# Patient Record
Sex: Female | Born: 1980 | Race: Black or African American | Hispanic: No | Marital: Single | Smoking: Never smoker
Health system: Southern US, Community
[De-identification: ages and names within clinical notes are randomized; demographics above are authoritative.]

## PROBLEM LIST (undated history)

## (undated) ENCOUNTER — Inpatient Hospital Stay (HOSPITAL_COMMUNITY): Payer: Self-pay

## (undated) DIAGNOSIS — Z789 Other specified health status: Secondary | ICD-10-CM

## (undated) HISTORY — DX: Other specified health status: Z78.9

## (undated) HISTORY — PX: NO PAST SURGERIES: SHX2092

---

## 2005-11-13 ENCOUNTER — Ambulatory Visit: Payer: Self-pay | Admitting: Family Medicine

## 2005-11-20 ENCOUNTER — Ambulatory Visit (HOSPITAL_COMMUNITY): Admission: RE | Admit: 2005-11-20 | Discharge: 2005-11-20 | Payer: Self-pay | Admitting: Gynecology

## 2006-06-27 ENCOUNTER — Inpatient Hospital Stay (HOSPITAL_COMMUNITY): Admission: AD | Admit: 2006-06-27 | Discharge: 2006-06-29 | Payer: Self-pay | Admitting: Obstetrics and Gynecology

## 2007-06-09 ENCOUNTER — Encounter (INDEPENDENT_AMBULATORY_CARE_PROVIDER_SITE_OTHER): Payer: Self-pay | Admitting: Obstetrics and Gynecology

## 2007-06-09 ENCOUNTER — Ambulatory Visit (HOSPITAL_COMMUNITY): Admission: RE | Admit: 2007-06-09 | Discharge: 2007-06-09 | Payer: Self-pay | Admitting: Obstetrics and Gynecology

## 2008-01-07 ENCOUNTER — Other Ambulatory Visit: Admission: RE | Admit: 2008-01-07 | Discharge: 2008-01-07 | Payer: Self-pay | Admitting: Gynecology

## 2010-10-23 NOTE — Op Note (Signed)
[name], [name]              ACCOUNT NO.:  [id number]   MEDICAL RECORD NO.:  [id number]          PATIENT TYPE:  AMB   LOCATION:  [facility]                           FACILITY:  [facility]   PHYSICIAN:  Janine Limbo, M.D.DATE OF BIRTH:  [birth year]   DATE OF PROCEDURE:  06/09/2007  DATE OF DISCHARGE:                               OPERATIVE REPORT   PREOPERATIVE DIAGNOSES:  1. Painful right labial laceration.  2. Vulvar itching and irritation.   POSTOPERATIVE DIAGNOSES:  1. Painful right labial laceration.  2. Vulvar itching and irritation.   PROCEDURES:  1. Repair of right labial laceration.  2. Biopsy of the vulva.   SURGEON:  Dr. Leonard Schwartz.   FIRST ASSISTANT:  None.   ANESTHETIC:  Monitored anesthetic control and local 0.5% Marcaine.   DISPOSITION:  The patient is a 30 year old female, para 1-0-0-1, who had  a vaginal delivery in August 2008.  The patient has a right labial  laceration that has been very uncomfortable since the time of her  delivery.  She also complains of dyspareunia.  She wishes to have this  area repaired.  The patient also complains of vulvar irritation and she  reports that frequently she has lacerations of the vulva for unknown  reasons.  The patient understands the indications for her surgical  procedure and she accepts the risks of, but not limited to, anesthetic  complications, bleeding, infection, and possible damage to the  surrounding organs.   FINDINGS:  The right labia majus had a laceration that measured  approximately 3.5 cm.  There was no evidence of inflammation or  infection.  The the patient's perineum was generally considered to be  erythematous and the skin was very thin.  She had multiple lacerations  of unknown etiology.   PROCEDURE:  The patient was taken to the operating room where she was  given medication through her IV line.  The patient was placed in a  lithotomy position.  The perineum, and outer vagina were  prepped with  multiple layers of Betadine.  The patient was then sterilely draped.  The laceration of the right labia majus was then injected with 8 mL of  0.5% Marcaine.  The vulva was injected with 2 mL of 0.5% Marcaine at the  7 o'clock position from the introitus.  A vulvar biopsy was obtained at  the 7 o'clock position from the introitus.  A single stitch of 2-0  Vicryl was placed for hemostasis.  The right labia majus was then  incised and the incision was then extended through the length of the  laceration and into the connective tissue.  The connective tissue was  then closed using 2-0 Vicryl, reapproximating a more normal-appearing  right labia majus.  A subcuticular suture was then placed using 5-0  Monocryl.  Hemostasis was achieved using the bipolar cautery and  hemostasis was adequate at the end of our procedure.  The  reapproximation appeared to be very similar to the left at this point.  Sponge, needle, and instrument counts were correct on two occasions.  The estimated blood loss for the  procedure was 15 mL.  The patient  tolerated her procedure well.  She was awakened from her anesthetic  without difficulty and taken to the recovery room in stable condition.   FOLLOWUP INSTRUCTIONS:  The patient will return to see Dr. Stefano Gaul in 2-  3 weeks for followup examination.  She was given ibuprofen and she will  take 600 mg every 6 hours as needed for mild to moderate pain.  She will  take Darvocet-N 100 1 tablet every 4-6 hours as needed for severe pain.  She will sit in a tub of hot water each day.  She will call for  questions or concerns.      Janine Limbo, M.D.  Electronically Signed     AVS/MEDQ  D:  06/09/2007  T:  06/09/2007  Job:  [id number]

## 2010-10-23 NOTE — H&P (Signed)
NAMEKIRK, SAMPLEY              ACCOUNT NO.:  [id number]   MEDICAL RECORD NO.:  [id number]          PATIENT TYPE:  AMB   LOCATION:  [facility]                           [facility]   PHYSICIAN:  Janine Limbo, M.D.DATE OF BIRTH:  Dec 04, 1980   DATE OF ADMISSION:  DATE OF DISCHARGE:                              HISTORY & PHYSICAL   HISTORY OF PRESENT ILLNESS:  The patient is a 30 year old female, para 1-  0-0-1, who presents for repair of the labia.  The patient complains of  vulvar discomfort with burning and discomfort where she had an  obstetrical laceration on her right labia.   OBSTETRICAL HISTORY:  The patient has had one vaginal delivery at term.   DRUG ALLERGIES:  NO KNOWN DRUG ALLERGIES.   PAST MEDICAL HISTORY:  The patient denies hypertension and diabetes.   REVIEW OF SYSTEMS:  Noncontributory except as mentioned above.   FAMILY HISTORY:  Noncontributory.   SOCIAL HISTORY:  The patient denies cigarette use, alcohol use, and  recreational drug use.   PHYSICAL EXAMINATION:  HEENT:  Within normal limits.  CHEST:  Clear.  HEART:  Regular rate and rhythm.  BREASTS:  Without masses.  ABDOMEN:  Nontender.  EXTREMITIES:  Grossly normal.  NEUROLOGIC:  Grossly normal.  PELVIC EXAM:  External genitalia shows that there is some thinning of  the skin at the introitus and around the rectum.  There are small areas  of broken skin at the perineal area.  There is a laceration of the right  labia majora, and there is tenderness to examination in this area.  The  vagina is within normal limits.  The cervix is nontender.  Uterus is  normal size, shape and consistency.  Adnexa no masses.   ASSESSMENT:  1. Right labial laceration.  2. Tender right labial laceration.   PLAN:  The patient will undergo repair of the right labia.  She has  already been started on estrogen therapy, and I am hoping that the  combination of these things will relieve her discomfort.  She  understands  that no guarantees can be given.  She accepts the risks of,  but not limited to, anesthetic complications, bleeding, infection, and  possible damage to surrounding organs.      Janine Limbo, M.D.  Electronically Signed     AVS/MEDQ  D:  06/07/2007  T:  06/08/2007  Job:  [id number]

## 2010-10-26 NOTE — Group Therapy Note (Signed)
[name], [name] NO.:  [id number]   MEDICAL RECORD NO.:  [id number]          PATIENT TYPE:  WOC   LOCATION:  [facility]                   FACILITY:  [facility]   PHYSICIAN:  Montey Hora, M.D.    DATE OF BIRTH:  [birth year]   DATE OF SERVICE:  11/13/2005                                    CLINIC NOTE   The patient is seen in clinic on November 13, 2005, for pelvic pain. This is a 30  year old, G0, who has been having squeezing, cramping lower abdominal pain  for approximately one and a half years. She states that it is related to her  menstrual cycle, happening primarily just prior to her menses and just after  her menses. She speculates she does not notice it during her menstrual cycle  because she has such heavy cramping. She also has heavy bleeding. Her  menstrual cycle lasts approximately seven days, however it is not as heavy  the entire time, being primarily heavy in the center. She uses no  contraception, in fact she is trying to get pregnant and has been trying for  seven to eight months. She denies any problems with her bowels or fever. She  has no vaginal discharge. She has just been checked twice for STDs and was  found to be negative in both cases. She has had only a single partner over  the last year. She has not had any weight loss, vomiting, diarrhea. She does  have a labial lesion that is very painful, that has been present for a  couple of months, and first appeared after using some Darene Lamer depilatory cream,  and that was about two or two and a half months ago.   PAST MEDICAL HISTORY:  Not significant except for yeast vaginitis. Last Pap  smear was in February 2007. Last menses was September 23, 2005. She has never  had a mammogram.   CURRENT MEDICATIONS:  None.   ALLERGIES:  None.  No latex allergy.   SOCIAL HISTORY:  Committed relationship. Nonsmoker.   PHYSICAL EXAMINATION:  VITAL SIGNS: Temperature is 97, pulse 81, BP 127/62,  weight 120, height 5  feet 2.5 inches.  GENERAL: WDWN, NAD.  ABDOMEN: Soft and nontender. No HSM, no masses.  PELVIC: Normal external genitalia, except for some erythema around the labia  bilaterally and particularly at the superior edge of the anus. In the anal  folds there are noted to be fissures. Vaginal mucosa is normal. There is a  normal physiologic discharge. The cervix is very anterior, but normal,  nulliparous. The uterus is about 6-8 weeks size and is retroflexed sharply.  There are no adnexal masses or tenderness bilaterally.   ASSESSMENT/PLAN:  1.  Lower abdominal pain, question ovarian cyst. Will obtain a pregnancy      test and if it is negative  do a pelvic ultrasound. If positive, put in      her in OB clinic.  2.  Labial fissure. Will obtain an HSV and treat p.r.n. Also gave her some      Mycolog II cream to use very sparingly b.i.d. for a  short period of time      until healed.           ______________________________  Montey Hora, M.D.     KR/MEDQ  D:  11/13/2005  T:  11/14/2005  Job:  [id number]

## 2010-10-26 NOTE — H&P (Signed)
[name], [name]           ACCOUNT NO.:  [id number]   MEDICAL RECORD NO.:  [id number]          PATIENT TYPE:  MAT   LOCATION:  [facility]                          [facility]   PHYSICIAN:  Hal Morales, M.D.DATE OF BIRTH:  14-Feb-1981   DATE OF ADMISSION:  06/27/2006  DATE OF DISCHARGE:                              HISTORY & PHYSICAL   This is a 30 year old, gravida 1, para 0 at 39-4/7 weeks who presents  with complaints of leaking fluids. She reports painful contractions and  positive fetal movement. The pregnancy has been followed by Dr. 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  2)  Group B strep negative.   ALLERGIES:  None.   OBSTETRIC HISTORY:  The patient is primigravida.   MEDICAL HISTORY:  Remarkable for childhood varicella otherwise negative.   FAMILY HISTORY:  Negative.   GENETIC HISTORY:  Negative.   SOCIAL HISTORY:  The patient is married to [name] who is involved and  supportive. She is of the [place] faith. She denies any alcohol,  tobacco or drug use.   PRENATAL LABS:  Hemoglobin 11.5, platelets 210, blood type O+, antibody  screen negative, sickle cell negative, RPR nonreactive. Rubella immune.  Hepatitis negative. Pap test normal. Gonorrhea negative, chlamydia  negative, cystic fibrosis positive.   HISTORY OF CURRENT PREGNANCY:  The patient entered care at [redacted] weeks  gestation. She had an anatomy ultrasound at 19 weeks which was normal.  She was treated with for a yeast infection at 23 weeks. She was notified  of her positive cystic fibrosis status and husband elected to do testing  in January when he has insurance. Glucola was done at 27 weeks and was  normal. She had a negative group B strep at term.   OBJECTIVE:  VITAL SIGNS:  Stable, afebrile.  HEENT:  Within normal limits. Thyroid normal not enlarged.  CHEST:  Clear to auscultation.  HEART:  Regular rate and rhythm.  ABDOMEN:  Gravid. Vertex to  [place], __________  shows reactive fetal  heart rate with contractions every 1-1/2 minutes, positive leaking of  clear fluid, positive Nitrazine. Cervix 4 cm, 100% effaced, 0 station,  vertex presentation per RN.  EXTREMITIES:  Within normal limits.   ASSESSMENT:  1. Intrauterine pregnancy at 39-4/7 weeks.  2. Active labor.  3. Spontaneous rupture of membranes.  4. Group B strep negative.   PLAN:  1. Admit to birthing suites per Dr. Pennie Rushing.  2. Routine MD orders.      Marie L. Williams, C.N.M.      Hal Morales, M.D.  Electronically Signed    MLW/MEDQ  D:  06/27/2006  T:  06/27/2006  Job:  [id number]

## 2011-03-15 LAB — URINE MICROSCOPIC-ADD ON

## 2011-03-15 LAB — URINALYSIS, ROUTINE W REFLEX MICROSCOPIC
Bilirubin Urine: NEGATIVE
Ketones, ur: NEGATIVE
Nitrite: NEGATIVE
Protein, ur: NEGATIVE
Urobilinogen, UA: 0.2

## 2011-03-15 LAB — CBC
HCT: 38.1
Hemoglobin: 12.9
MCHC: 33.9
RBC: 4.29
RDW: 13.1

## 2011-03-15 LAB — PREGNANCY, URINE: Preg Test, Ur: NEGATIVE

## 2012-09-14 ENCOUNTER — Ambulatory Visit: Payer: Self-pay | Admitting: Obstetrics

## 2012-10-14 ENCOUNTER — Ambulatory Visit: Payer: Self-pay | Admitting: Obstetrics

## 2012-11-03 ENCOUNTER — Ambulatory Visit: Payer: Self-pay | Admitting: Obstetrics

## 2012-11-04 ENCOUNTER — Encounter: Payer: Self-pay | Admitting: Obstetrics

## 2012-11-04 ENCOUNTER — Ambulatory Visit (INDEPENDENT_AMBULATORY_CARE_PROVIDER_SITE_OTHER): Payer: 59 | Admitting: Obstetrics

## 2012-11-04 VITALS — BP 117/77 | HR 68 | Temp 96.7°F | Ht 62.5 in | Wt 127.0 lb

## 2012-11-04 DIAGNOSIS — Z01419 Encounter for gynecological examination (general) (routine) without abnormal findings: Secondary | ICD-10-CM

## 2012-11-04 DIAGNOSIS — Z0000 Encounter for general adult medical examination without abnormal findings: Secondary | ICD-10-CM

## 2012-11-04 DIAGNOSIS — N760 Acute vaginitis: Secondary | ICD-10-CM

## 2012-11-04 NOTE — Progress Notes (Signed)
  Subjective:     [name] is a 32 y.o. female and is here for a comprehensive physical exam. The patient reports abdominal pain on left side that comes and goes length unknown.  History   Social History  . Marital Status: Single    Spouse Name: N/A    Number of Children: N/A  . Years of Education: N/A   Occupational History  . Not on file.   Social History Main Topics  . Smoking status: Never Smoker   . Smokeless tobacco: Never Used  . Alcohol Use: No  . Drug Use: No  . Sexually Active: No   Other Topics Concern  . Not on file   Social History Narrative  . No narrative on file   Health Maintenance  Topic Date Due  . Pap Smear  03/21/1998  . Tetanus/tdap  03/22/1999  . Influenza Vaccine  02/08/2013    The following portions of the patient's history were reviewed and updated as appropriate: allergies, current medications, past family history, past medical history, past social history, past surgical history and problem list.  Review of Systems Pertinent items are noted in HPI.   Objective:    General appearance: alert and no distress Breasts: normal appearance, no masses or tenderness Abdomen: normal findings: soft, non-tender Pelvic: cervix normal in appearance, external genitalia normal, no adnexal masses or tenderness, no cervical motion tenderness, uterus normal size, shape, and consistency and vagina normal without discharge    Assessment:    Healthy female exam. H/O irregular cycles.  Desires future fertility.  Considering cycle regulation with OCP's until pregnancy.      Plan:     See After Visit Summary for Counseling Recommendations  Folic acid recommended. Preconception counseling done.

## 2012-11-05 ENCOUNTER — Encounter: Payer: Self-pay | Admitting: Obstetrics

## 2012-11-05 DIAGNOSIS — N760 Acute vaginitis: Secondary | ICD-10-CM | POA: Insufficient documentation

## 2012-11-05 LAB — PAP IG W/ RFLX HPV ASCU

## 2012-11-05 LAB — WET PREP BY MOLECULAR PROBE: Candida species: POSITIVE — AB

## 2012-11-05 LAB — GC/CHLAMYDIA PROBE AMP: GC Probe RNA: NEGATIVE

## 2012-11-05 NOTE — Patient Instructions (Signed)
Preconception Cycle regulation

## 2012-11-18 ENCOUNTER — Ambulatory Visit: Payer: 59 | Admitting: Obstetrics

## 2013-03-30 ENCOUNTER — Encounter: Payer: Self-pay | Admitting: Obstetrics

## 2013-03-30 ENCOUNTER — Encounter: Payer: Self-pay | Admitting: *Deleted

## 2013-03-30 ENCOUNTER — Ambulatory Visit (INDEPENDENT_AMBULATORY_CARE_PROVIDER_SITE_OTHER): Payer: 59 | Admitting: Obstetrics

## 2013-03-30 VITALS — BP 113/79 | HR 69 | Wt 121.0 lb

## 2013-03-30 DIAGNOSIS — Z0000 Encounter for general adult medical examination without abnormal findings: Secondary | ICD-10-CM

## 2013-03-30 DIAGNOSIS — F411 Generalized anxiety disorder: Secondary | ICD-10-CM

## 2013-03-30 DIAGNOSIS — N949 Unspecified condition associated with female genital organs and menstrual cycle: Secondary | ICD-10-CM

## 2013-03-30 DIAGNOSIS — F418 Other specified anxiety disorders: Secondary | ICD-10-CM | POA: Insufficient documentation

## 2013-03-30 MED ORDER — VITAFOL-ONE 29-1-200 MG PO CAPS: 1.0000 | ORAL_CAPSULE | Freq: Every day | ORAL | Status: DC

## 2013-03-30 NOTE — Progress Notes (Signed)
Subjective:     [name] is a 32 y.o. female here for a routine exam.  Current complaints: Emotionally stressed with work, supervision and family responsibilities; and just feels burnt out.  Personal health questionnaire reviewed: yes.   Gynecologic History Patient's last menstrual period was 03/25/2013. Contraception: none Last Pap: 2014. Results were: normal Last mammogram: n/a. Results were: n/a  Obstetric History OB History  No data available     The following portions of the patient's history were reviewed and updated as appropriate: allergies, current medications, past family history, past medical history, past social history, past surgical history and problem list.  Review of Systems Pertinent items are noted in HPI.    Objective:    No exam performed today, Consult only..    Assessment:    Anxiety and burn out.   Plan:    Education reviewed: Management of stress and anxiety.. Follow up in: 4 weeks.    Medical leave from work granted. Re-evaluate fitness to return to work in 4 weeks.

## 2013-03-30 NOTE — Progress Notes (Signed)
Patient is in the office today for consult to discuss her stresses at work.

## 2013-03-31 ENCOUNTER — Other Ambulatory Visit: Payer: Self-pay | Admitting: Obstetrics

## 2013-03-31 DIAGNOSIS — N949 Unspecified condition associated with female genital organs and menstrual cycle: Secondary | ICD-10-CM

## 2013-04-02 ENCOUNTER — Other Ambulatory Visit: Payer: Self-pay | Admitting: *Deleted

## 2013-04-02 ENCOUNTER — Encounter: Payer: Self-pay | Admitting: Obstetrics

## 2013-04-02 DIAGNOSIS — N949 Unspecified condition associated with female genital organs and menstrual cycle: Secondary | ICD-10-CM

## 2013-04-06 ENCOUNTER — Other Ambulatory Visit: Payer: 59

## 2013-04-13 ENCOUNTER — Ambulatory Visit (INDEPENDENT_AMBULATORY_CARE_PROVIDER_SITE_OTHER): Payer: 59

## 2013-04-13 ENCOUNTER — Encounter: Payer: Self-pay | Admitting: Obstetrics & Gynecology

## 2013-04-13 DIAGNOSIS — N926 Irregular menstruation, unspecified: Secondary | ICD-10-CM

## 2013-04-13 DIAGNOSIS — N949 Unspecified condition associated with female genital organs and menstrual cycle: Secondary | ICD-10-CM

## 2013-04-13 DIAGNOSIS — IMO0002 Reserved for concepts with insufficient information to code with codable children: Secondary | ICD-10-CM

## 2013-05-04 ENCOUNTER — Ambulatory Visit (INDEPENDENT_AMBULATORY_CARE_PROVIDER_SITE_OTHER): Payer: 59 | Admitting: Obstetrics

## 2013-05-04 ENCOUNTER — Encounter: Payer: Self-pay | Admitting: Obstetrics

## 2013-05-04 NOTE — Progress Notes (Signed)
Subjective:     [name] is a 32 y.o. female here for a follow up exam.  Current complaints:requesting extension to off work status,  irregular menstrual cycle, .  Personal health questionnaire reviewed: yes.   Gynecologic History Patient's last menstrual period was 04/18/2013. Contraception: abstinence Last Pap:11/04/2012 . Results were: normal Last mammogram: N/A  Obstetric History OB History  Gravida Para Term Preterm AB SAB TAB Ectopic Multiple Living  1 1 1       1     # Outcome Date GA Lbr Len/2nd Weight Sex Delivery Anes PTL Lv  1 TRM                The following portions of the patient's history were reviewed and updated as appropriate: allergies, current medications, past family history, past medical history, past social history, past surgical history and problem list.  Review of Systems Pertinent items are noted in HPI.    Objective:    No exam performed today, Consultation only..    Assessment:    Postpartum stress / anxiety.  Improved, but not yet resolved enough to return to work.  Recommended continued counseling.   Plan:    Education reviewed: Stress management.. Contraception: none. Follow up in: 6 weeks. Continue maternity leave until June 22, 2013.

## 2013-05-05 ENCOUNTER — Encounter: Payer: Self-pay | Admitting: Obstetrics

## 2013-05-13 ENCOUNTER — Encounter: Payer: Self-pay | Admitting: Obstetrics

## 2013-05-28 ENCOUNTER — Encounter: Payer: Self-pay | Admitting: *Deleted

## 2013-06-10 NOTE — L&D Delivery Note (Signed)
Delivery Note At 2:30 PM a non-viable unspecified sex was delivered via Vaginal, Spontaneous Delivery.  APGAR: 0, 0 .   Placenta status: Intact, removed with assistance Pathology.   Anesthesia: None  Episiotomy: None Lacerations: None Suture Repair: n/a Est. Blood Loss (mL): 150  Mom to postpartum.  Baby to [place].  JACKSON-MOORE,Arvin Abello A 02/23/2014, 2:48 PM

## 2013-06-14 ENCOUNTER — Encounter: Payer: Self-pay | Admitting: Obstetrics

## 2013-06-22 ENCOUNTER — Telehealth: Payer: Self-pay | Admitting: Internal Medicine

## 2013-06-22 NOTE — Telephone Encounter (Signed)
Pt would like to be contacted about establishing care and extending medical leave; please call and advise pt on this matter [phone]

## 2013-07-28 ENCOUNTER — Encounter: Payer: Self-pay | Admitting: Obstetrics

## 2013-08-04 ENCOUNTER — Encounter: Payer: Self-pay | Admitting: Obstetrics

## 2013-09-22 ENCOUNTER — Other Ambulatory Visit: Payer: Self-pay | Admitting: *Deleted

## 2013-09-22 DIAGNOSIS — B373 Candidiasis of vulva and vagina: Secondary | ICD-10-CM

## 2013-09-22 DIAGNOSIS — B3731 Acute candidiasis of vulva and vagina: Secondary | ICD-10-CM

## 2013-09-22 MED ORDER — TERCONAZOLE 0.4 % VA CREA: 1.0000 | TOPICAL_CREAM | Freq: Every day | VAGINAL | Status: AC

## 2014-02-03 ENCOUNTER — Encounter: Payer: Self-pay | Admitting: Obstetrics

## 2014-02-03 ENCOUNTER — Ambulatory Visit (INDEPENDENT_AMBULATORY_CARE_PROVIDER_SITE_OTHER): Payer: Medicaid Other | Admitting: Obstetrics

## 2014-02-03 VITALS — BP 109/62 | HR 83 | Temp 97.9°F | Wt 133.0 lb

## 2014-02-03 DIAGNOSIS — Z1389 Encounter for screening for other disorder: Secondary | ICD-10-CM

## 2014-02-03 DIAGNOSIS — Z3482 Encounter for supervision of other normal pregnancy, second trimester: Secondary | ICD-10-CM

## 2014-02-03 DIAGNOSIS — Z3480 Encounter for supervision of other normal pregnancy, unspecified trimester: Secondary | ICD-10-CM

## 2014-02-03 DIAGNOSIS — Z363 Encounter for antenatal screening for malformations: Secondary | ICD-10-CM

## 2014-02-03 LAB — POCT URINALYSIS DIPSTICK
BILIRUBIN UA: NEGATIVE
Blood, UA: NEGATIVE
Glucose, UA: NEGATIVE
Ketones, UA: NEGATIVE
LEUKOCYTES UA: NEGATIVE
NITRITE UA: NEGATIVE
PROTEIN UA: NEGATIVE
SPEC GRAV UA: 1.02
Urobilinogen, UA: NEGATIVE
pH, UA: 5

## 2014-02-03 LAB — OB RESULTS CONSOLE GC/CHLAMYDIA
Chlamydia: NEGATIVE
Gonorrhea: NEGATIVE

## 2014-02-03 NOTE — Progress Notes (Signed)
  Subjective:    [name] is a 33 y.o. female being seen today for her obstetrical visit. She is at [redacted]w[redacted]d gestation. Patient reports: no complaints.  Problem List Items Addressed This Visit   None    Visit Diagnoses   Encounter for supervision of other normal pregnancy in second trimester    -  Primary    Relevant Orders       Obstetric panel       HIV antibody       Hemoglobinopathy evaluation       Varicella zoster antibody, IgG       Vit D  25 hydroxy (rtn osteoporosis monitoring)       Culture, OB Urine       POCT urinalysis dipstick (Completed)       WET PREP BY MOLECULAR PROBE       GC/Chlamydia Probe Amp       Pap IG and HPV (high risk) DNA detection    Encounter for routine screening for malformation using ultrasonics        Relevant Orders       US OB Comp + 14 Wk      Patient Active Problem List   Diagnosis Date Noted  . 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 Vaginitis and vulvovaginitis, unspecified 11/05/2012    Objective:     BP 109/62  Pulse 83  Temp(Src) 97.9 F (36.6 C)  Wt 133 lb (60.328 kg)  LMP 09/29/2013 Uterine Size: Below umbilicus     Assessment:    Pregnancy @ [redacted]w[redacted]d  weeks Doing well    Plan:    Problem list reviewed and updated. Labs reviewed.  Follow up in 2 weeks. FIRST/CF mutation testing/NIPT/QUAD SCREEN/fragile X/[name] population testing/Spinal muscular atrophy discussed: requested. Role of ultrasound in pregnancy discussed; fetal survey: requested. Amniocentesis discussed: not indicated. 100% of 10 minute visit spent on counseling and coordination of care.

## 2014-02-04 LAB — OBSTETRIC PANEL
Antibody Screen: NEGATIVE
BASOS PCT: 0 % (ref 0–1)
Basophils Absolute: 0 10*3/uL (ref 0.0–0.1)
EOS ABS: 0.1 10*3/uL (ref 0.0–0.7)
Eosinophils Relative: 2 % (ref 0–5)
HEMATOCRIT: 32.8 % — AB (ref 36.0–46.0)
HEP B S AG: NEGATIVE
Hemoglobin: 11.3 g/dL — ABNORMAL LOW (ref 12.0–15.0)
LYMPHS PCT: 22 % (ref 12–46)
Lymphs Abs: 1.5 10*3/uL (ref 0.7–4.0)
MCH: 29.9 pg (ref 26.0–34.0)
MCHC: 34.5 g/dL (ref 30.0–36.0)
MCV: 86.8 fL (ref 78.0–100.0)
Monocytes Absolute: 0.5 10*3/uL (ref 0.1–1.0)
Monocytes Relative: 8 % (ref 3–12)
Neutro Abs: 4.5 10*3/uL (ref 1.7–7.7)
Neutrophils Relative %: 68 % (ref 43–77)
Platelets: 225 10*3/uL (ref 150–400)
RBC: 3.78 MIL/uL — ABNORMAL LOW (ref 3.87–5.11)
RDW: 14 % (ref 11.5–15.5)
RH TYPE: POSITIVE
Rubella: 27.2 Index — ABNORMAL HIGH (ref <0.90)
WBC: 6.6 10*3/uL (ref 4.0–10.5)

## 2014-02-04 LAB — WET PREP BY MOLECULAR PROBE
CANDIDA SPECIES: NEGATIVE
Gardnerella vaginalis: NEGATIVE
TRICHOMONAS VAG: NEGATIVE

## 2014-02-04 LAB — GC/CHLAMYDIA PROBE AMP
CT Probe RNA: NEGATIVE
GC Probe RNA: NEGATIVE

## 2014-02-04 LAB — HIV ANTIBODY (ROUTINE TESTING W REFLEX): HIV 1&2 Ab, 4th Generation: NONREACTIVE

## 2014-02-04 LAB — VITAMIN D 25 HYDROXY (VIT D DEFICIENCY, FRACTURES): Vit D, 25-Hydroxy: 51 ng/mL (ref 30–89)

## 2014-02-04 LAB — VARICELLA ZOSTER ANTIBODY, IGG

## 2014-02-05 LAB — CULTURE, OB URINE
COLONY COUNT: NO GROWTH
Organism ID, Bacteria: NO GROWTH

## 2014-02-07 LAB — HEMOGLOBINOPATHY EVALUATION
HGB A: 97 % (ref 96.8–97.8)
HGB F QUANT: 0 % (ref 0.0–2.0)
HGB S QUANTITAION: 0 %
Hemoglobin Other: 0 %
Hgb A2 Quant: 3 % (ref 2.2–3.2)

## 2014-02-07 LAB — PAP IG AND HPV HIGH-RISK: HPV DNA High Risk: DETECTED — AB

## 2014-02-10 ENCOUNTER — Ambulatory Visit (HOSPITAL_COMMUNITY): Admission: RE | Admit: 2014-02-10 | Payer: Medicaid Other | Source: Ambulatory Visit

## 2014-02-17 ENCOUNTER — Encounter: Payer: Medicaid Other | Admitting: Obstetrics

## 2014-02-18 ENCOUNTER — Ambulatory Visit (HOSPITAL_COMMUNITY): Payer: Medicaid Other

## 2014-02-22 ENCOUNTER — Inpatient Hospital Stay (HOSPITAL_COMMUNITY): Payer: Medicaid Other

## 2014-02-22 ENCOUNTER — Encounter (HOSPITAL_COMMUNITY): Payer: Self-pay

## 2014-02-22 ENCOUNTER — Encounter: Payer: Medicaid Other | Admitting: Obstetrics

## 2014-02-22 ENCOUNTER — Inpatient Hospital Stay (HOSPITAL_COMMUNITY)
Admission: AD | Admit: 2014-02-22 | Discharge: 2014-02-24 | DRG: 779 | Disposition: A | Discharge status: Home / Self Care | Payer: Medicaid Other | Source: Ambulatory Visit | Attending: Obstetrics & Gynecology | Admitting: Obstetrics & Gynecology

## 2014-02-22 DIAGNOSIS — O42919 Preterm premature rupture of membranes, unspecified as to length of time between rupture and onset of labor, unspecified trimester: Secondary | ICD-10-CM | POA: Diagnosis present

## 2014-02-22 DIAGNOSIS — O99891 Other specified diseases and conditions complicating pregnancy: Secondary | ICD-10-CM | POA: Diagnosis present

## 2014-02-22 DIAGNOSIS — O021 Missed abortion: Secondary | ICD-10-CM | POA: Diagnosis present

## 2014-02-22 DIAGNOSIS — Z1389 Encounter for screening for other disorder: Secondary | ICD-10-CM

## 2014-02-22 DIAGNOSIS — Z363 Encounter for antenatal screening for malformations: Secondary | ICD-10-CM

## 2014-02-22 DIAGNOSIS — O4290 Premature rupture of membranes, unspecified as to length of time between rupture and onset of labor, unspecified weeks of gestation: Secondary | ICD-10-CM | POA: Diagnosis present

## 2014-02-22 DIAGNOSIS — Z3687 Encounter for antenatal screening for uncertain dates: Secondary | ICD-10-CM

## 2014-02-22 LAB — CBC
HEMATOCRIT: 32 % — AB (ref 36.0–46.0)
HEMOGLOBIN: 11 g/dL — AB (ref 12.0–15.0)
MCH: 30.7 pg (ref 26.0–34.0)
MCHC: 34.4 g/dL (ref 30.0–36.0)
MCV: 89.4 fL (ref 78.0–100.0)
Platelets: 193 10*3/uL (ref 150–400)
RBC: 3.58 MIL/uL — AB (ref 3.87–5.11)
RDW: 13 % (ref 11.5–15.5)
WBC: 9.7 10*3/uL (ref 4.0–10.5)

## 2014-02-22 LAB — ABO/RH: ABO/RH(D): O POS

## 2014-02-22 LAB — TYPE AND SCREEN
ABO/RH(D): O POS
Antibody Screen: NEGATIVE

## 2014-02-22 LAB — POCT FERN TEST: POCT FERN TEST: POSITIVE

## 2014-02-22 LAB — RPR

## 2014-02-22 MED ORDER — SODIUM CHLORIDE 0.9 % IV SOLN
2.0000 g | Freq: Four times a day (QID) | INTRAVENOUS | Status: DC
  Administered 2014-02-22 – 2014-02-23 (×6): 2 g via INTRAVENOUS
  Filled 2014-02-22 (×8): qty 2000

## 2014-02-22 MED ORDER — BUTORPHANOL TARTRATE 1 MG/ML IJ SOLN: 2.0000 mg | INTRAMUSCULAR | Status: DC | PRN

## 2014-02-22 MED ORDER — CALCIUM CARBONATE ANTACID 500 MG PO CHEW
2.0000 | CHEWABLE_TABLET | ORAL | Status: DC | PRN
  Filled 2014-02-22: qty 2

## 2014-02-22 MED ORDER — ERYTHROMYCIN BASE 250 MG PO TABS
250.0000 mg | ORAL_TABLET | Freq: Four times a day (QID) | ORAL | Status: DC
  Filled 2014-02-22 (×2): qty 1

## 2014-02-22 MED ORDER — DOCUSATE SODIUM 100 MG PO CAPS
100.0000 mg | ORAL_CAPSULE | Freq: Every day | ORAL | Status: DC
  Filled 2014-02-22 (×4): qty 1

## 2014-02-22 MED ORDER — LACTATED RINGERS IV SOLN
INTRAVENOUS | Status: DC
  Administered 2014-02-22: 125 mL/h via INTRAVENOUS
  Administered 2014-02-22: 15:00:00 via INTRAVENOUS

## 2014-02-22 MED ORDER — ZOLPIDEM TARTRATE 5 MG PO TABS: 5.0000 mg | ORAL_TABLET | Freq: Every evening | ORAL | Status: DC | PRN

## 2014-02-22 MED ORDER — PRENATAL MULTIVITAMIN CH
1.0000 | ORAL_TABLET | Freq: Every day | ORAL | Status: DC
  Administered 2014-02-23: 1 via ORAL
  Filled 2014-02-22 (×5): qty 1

## 2014-02-22 MED ORDER — SODIUM CHLORIDE 0.9 % IV SOLN
250.0000 mg | Freq: Four times a day (QID) | INTRAVENOUS | Status: DC
  Administered 2014-02-22 – 2014-02-23 (×6): 250 mg via INTRAVENOUS
  Filled 2014-02-22 (×8): qty 5

## 2014-02-22 MED ORDER — AMOXICILLIN 500 MG PO CAPS
500.0000 mg | ORAL_CAPSULE | Freq: Three times a day (TID) | ORAL | Status: DC
  Filled 2014-02-22 (×2): qty 1

## 2014-02-22 MED ORDER — ACETAMINOPHEN 325 MG PO TABS: 650.0000 mg | ORAL_TABLET | ORAL | Status: DC | PRN

## 2014-02-22 NOTE — MAU Note (Signed)
Pt placed in trendelenburg; toco applied.

## 2014-02-22 NOTE — Progress Notes (Signed)
[name] is a 33 y.o. G2P1001 at [redacted]w[redacted]d by LMP admitted for PPROM.  Subjective:   Objective: BP 103/72  Pulse 83  Temp(Src) 97.5 F (36.4 C) (Oral)  Resp 20  Ht 5' 2.5" (1.588 m)  Wt 133 lb (60.328 kg)  BMI 23.92 kg/m2  LMP 09/29/2013      FHT:  150bpm UC:   Occasional SVE:    Omitted  Labs: Lab Results  Component Value Date   WBC 6.6 02/03/2014   HGB 11.3* 02/03/2014   HCT 32.8* 02/03/2014   MCV 86.8 02/03/2014   PLT 225 02/03/2014    Assessment / Plan: 20.6 weeks.  PPROM.  Stable.  Bedrest.  Labor: None Preeclampsia:  n/a Fetal Wellbeing:  Stable Pain Control:  n/a I/D:  n/a Anticipated MOD:  NSVD  HARPER,CHARLES A 02/22/2014, 9:20 AM

## 2014-02-22 NOTE — Progress Notes (Signed)
02/22/14 1700  Clinical Encounter Type  Visited With Patient  Visit Type Initial;Spiritual support;Social support  Referral From Nurse  Spiritual Encounters  Spiritual Needs Emotional;Prayer  Stress Factors  Patient Stress Factors Loss of control;Major life changes   Visited for intro to services/availability, spiritual/emotional support, encouragement.  Provided pastoral presence, witness to pt's stories, affirmation, spiritual companionship, prayer.  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  She is using faith, humor, perspective to cope.  Reports good support.  [place] will follow closely, but please page as situation changes or other needs arise.  Thank you.  [place], [place]

## 2014-02-22 NOTE — MAU Note (Signed)
Pt states that she felt pressure while at work. Went to bathroom and noticed that she had a bulging bag out of vagina. Pt called nurse into the bathroom at place of employment and says that the nurse "broke her water" around 2am/ States it was clear fluid. Denies pain or bleeding.

## 2014-02-22 NOTE — Progress Notes (Addendum)
When RN entered patient's room, patient had repositioned herself into low fowlers position. Patient reported that she felt short of breath while in trendelenburg. Patient states she wishes to stay positioned with her head elevated.

## 2014-02-22 NOTE — MAU Provider Note (Signed)
  History     CSN: [id number]  Arrival date and time: 02/22/14 0228   First Provider Initiated Contact with Patient 02/22/14 0234      Chief Complaint  Patient presents with  . Rupture of Membranes   HPI [name] is a 33 y.o. G2P1001 at [redacted]w[redacted]d by unsure LMP who presents to MAU today with complaint of ROM. The patient states that she had noted pressure in the lower abdomen tonight while at work. Around 0200 she went to the bathroom and noted a BBOW protruding from the vagina. She denies history of PTL or PTD. She states that she called for the other RN to help her and that she broke the bag. She denies significant pain now. States occasional lower abdominal pressure that comes and goes and mild cramping. She reports normal fetal movement.   OB History   Grav Para Term Preterm Abortions TAB SAB Ect Mult Living   2 1 1       1       Past Medical History  Diagnosis Date  . Medical history non-contributory     Past Surgical History  Procedure Laterality Date  . Vaginal delivery  2008    epsiotomy with delivery  . No past surgeries      No family history on file.  History  Substance Use Topics  . Smoking status: Never Smoker   . Smokeless tobacco: Never Used  . Alcohol Use: No    Allergies: No Known Allergies  Prescriptions prior to admission  Medication Sig Dispense Refill  . Prenatal Vit-FePoly-FA-DHA (VITAFOL-ONE) 29-1-200 MG CAPS Take 1 capsule by mouth daily before breakfast.  30 capsule  11    Review of Systems  Gastrointestinal: Negative for abdominal pain.  Genitourinary:       + LOF   Physical Exam   Blood pressure 115/54, pulse 76, temperature 98.2 F (36.8 C), temperature source Oral, resp. rate 18, height 5' 2.5" (1.588 m), weight 133 lb (60.328 kg), last menstrual period 09/29/2013.  Physical Exam  Constitutional: She is oriented to person, place, and time. She appears well-developed and well-nourished. No distress.  HENT:  Head:  Normocephalic.  Cardiovascular: Normal rate.   Respiratory: Effort normal.  GI: Soft. She exhibits no distension and no mass. There is no tenderness. There is no rebound and no guarding.  Neurological: She is alert and oriented to person, place, and time.  Skin: Skin is warm and dry. No erythema.  Psychiatric: She has a normal mood and affect.   Results for orders placed during the hospital encounter of 02/22/14 (from the past 24 hour(s))  POCT FERN TEST     Status: None   Collection Time    02/22/14  3:17 AM      Result Value Ref Range   POCT Fern Test Positive = ruptured amniotic membanes       MAU Course  Procedures None  MDM Fern slide obtained - +ROM Bedside US ordered for dating, AFI Discussed patient with Dr. Delsa Sale. Confirm dating prior to admission.  Preliminary [place] states ~ [redacted] wks gestation. RN confirmed that patient will be admitted to L&D due to gestation > 20 weeks.  Orders entered in Epic for antibiotics for PPROM and antenatal routine orders. Dr. Delsa Sale is aware  Assessment and Plan  A: [facility] at [redacted]w[redacted]d PPROM in a pre-viable pregnancy  P: Admit to L&D  Luvenia Redden, PA-C 02/22/2014, 3:29 AM

## 2014-02-23 ENCOUNTER — Encounter (HOSPITAL_COMMUNITY): Payer: Self-pay

## 2014-02-23 MED ORDER — ZOLPIDEM TARTRATE 5 MG PO TABS: 5.0000 mg | ORAL_TABLET | Freq: Every evening | ORAL | Status: DC | PRN

## 2014-02-23 MED ORDER — ONDANSETRON HCL 4 MG/2ML IJ SOLN: 4.0000 mg | INTRAMUSCULAR | Status: DC | PRN

## 2014-02-23 MED ORDER — BENZOCAINE-MENTHOL 20-0.5 % EX AERO: 1.0000 "application " | INHALATION_SPRAY | CUTANEOUS | Status: DC | PRN

## 2014-02-23 MED ORDER — SENNOSIDES-DOCUSATE SODIUM 8.6-50 MG PO TABS: 2.0000 | ORAL_TABLET | ORAL | Status: DC

## 2014-02-23 MED ORDER — IBUPROFEN 600 MG PO TABS: 600.0000 mg | ORAL_TABLET | Freq: Four times a day (QID) | ORAL | Status: DC

## 2014-02-23 MED ORDER — MISOPROSTOL 200 MCG PO TABS
400.0000 ug | ORAL_TABLET | Freq: Once | ORAL | Status: DC
  Filled 2014-02-23: qty 2

## 2014-02-23 MED ORDER — TERBUTALINE SULFATE 1 MG/ML IJ SOLN: 0.2500 mg | Freq: Once | INTRAMUSCULAR | Status: DC | PRN

## 2014-02-23 MED ORDER — TETANUS-DIPHTH-ACELL PERTUSSIS 5-2.5-18.5 LF-MCG/0.5 IM SUSP: 0.5000 mL | Freq: Once | INTRAMUSCULAR | Status: DC

## 2014-02-23 MED ORDER — FERROUS SULFATE 325 (65 FE) MG PO TABS
325.0000 mg | ORAL_TABLET | Freq: Two times a day (BID) | ORAL | Status: DC
Start: 2014-02-23 — End: 2014-02-24

## 2014-02-23 MED ORDER — OXYCODONE-ACETAMINOPHEN 5-325 MG PO TABS: 1.0000 | ORAL_TABLET | ORAL | Status: DC | PRN

## 2014-02-23 MED ORDER — MEASLES, MUMPS & RUBELLA VAC ~~LOC~~ INJ: 0.5000 mL | INJECTION | Freq: Once | SUBCUTANEOUS | Status: DC

## 2014-02-23 MED ORDER — LANOLIN HYDROUS EX OINT: TOPICAL_OINTMENT | CUTANEOUS | Status: DC | PRN

## 2014-02-23 MED ORDER — BUTORPHANOL TARTRATE 1 MG/ML IJ SOLN: 2.0000 mg | INTRAMUSCULAR | Status: DC | PRN

## 2014-02-23 MED ORDER — MAGNESIUM HYDROXIDE 400 MG/5ML PO SUSP: 30.0000 mL | ORAL | Status: DC | PRN

## 2014-02-23 MED ORDER — WITCH HAZEL-GLYCERIN EX PADS: 1.0000 "application " | MEDICATED_PAD | CUTANEOUS | Status: DC | PRN

## 2014-02-23 MED ORDER — INFLUENZA VAC SPLIT QUAD 0.5 ML IM SUSY: 0.5000 mL | PREFILLED_SYRINGE | INTRAMUSCULAR | Status: DC

## 2014-02-23 MED ORDER — DIBUCAINE 1 % RE OINT: 1.0000 "application " | TOPICAL_OINTMENT | RECTAL | Status: DC | PRN

## 2014-02-23 MED ORDER — OXYCODONE-ACETAMINOPHEN 5-325 MG PO TABS: 2.0000 | ORAL_TABLET | ORAL | Status: DC | PRN

## 2014-02-23 MED ORDER — ONDANSETRON HCL 4 MG PO TABS: 4.0000 mg | ORAL_TABLET | ORAL | Status: DC | PRN

## 2014-02-23 MED ORDER — PRENATAL MULTIVITAMIN CH
1.0000 | ORAL_TABLET | Freq: Every day | ORAL | Status: DC
  Administered 2014-02-24: 1 via ORAL
  Filled 2014-02-23: qty 1

## 2014-02-23 MED ORDER — BUPIVACAINE HCL (PF) 0.25 % IJ SOLN
INTRAMUSCULAR | Status: AC
  Filled 2014-02-23: qty 30

## 2014-02-23 MED ORDER — OXYTOCIN 40 UNITS IN LACTATED RINGERS INFUSION - SIMPLE MED
1.0000 m[IU]/min | INTRAVENOUS | Status: DC
  Administered 2014-02-23: 2 m[IU]/min via INTRAVENOUS
  Administered 2014-02-23: 4 m[IU]/min via INTRAVENOUS

## 2014-02-23 MED ORDER — DIPHENHYDRAMINE HCL 25 MG PO CAPS: 25.0000 mg | ORAL_CAPSULE | Freq: Four times a day (QID) | ORAL | Status: DC | PRN

## 2014-02-23 MED ORDER — OXYTOCIN 40 UNITS IN LACTATED RINGERS INFUSION - SIMPLE MED
INTRAVENOUS | Status: AC
  Administered 2014-02-23: 4 m[IU]/min via INTRAVENOUS
  Filled 2014-02-23: qty 1000

## 2014-02-23 NOTE — Progress Notes (Signed)
[name] is a 33 y.o. G2P1001 at [redacted]w[redacted]d by LMP admitted for PPROM  Subjective: C/O rectal pressure  Objective: BP 115/59  Pulse 91  Temp(Src) 97.8 F (36.6 C) (Oral)  Resp 18  Ht 5' 2.5" (1.588 m)  Wt 60.328 kg (133 lb)  BMI 23.92 kg/m2  LMP 09/29/2013      SVE: cx 3 - 4 cm dilated; arm at introitus  Labs: Lab Results  Component Value Date   WBC 9.7 02/22/2014   HGB 11.0* 02/22/2014   HCT 32.0* 02/22/2014   MCV 89.4 02/22/2014   PLT 193 02/22/2014    Assessment / Plan: Delivery imminent   Labor: see above, Cytotec vaginally Preeclampsia:  n/a Pain Control:  Labor support without medications I/D:  n/a Anticipated MOD:  NSVD  JACKSON-MOORE,Amarria Andreasen A 02/23/2014, 2:17 PM

## 2014-02-23 NOTE — Progress Notes (Signed)
Pt stated she felt something in vagina when up to bathroom.  Baby's hand hanging out of vagina.  Dr Delsa Sale at bedside.

## 2014-02-23 NOTE — Progress Notes (Signed)
I was present with [name] while she delivered her baby girl and provided spiritual, emotional and grief support.  Her pastor came to provide additional support to her.  She is still thinking about how she wants to handle burial or cremation and plans to talk with her pastor and her baby's father.    We will continue to provide emotional and spiritual support as needed.  Please page as needs arise.  [place] Pager, [phone] 3:38 PM   02/23/14 1500  Clinical Encounter Type  Visited With Patient;Health care provider  Visit Type Spiritual support  Referral From Nurse;Chaplain  Spiritual Encounters  Spiritual Needs Emotional;Grief support

## 2014-02-23 NOTE — H&P (Signed)
Chief Complaint   Patient presents with   .  Rupture of Membranes    HPI  [name] is a 33 y.o. G2P1001 at [redacted]w[redacted]d by unsure LMP who presents to [facility]U today with complaint of ROM. The patient states that she had noted pressure in the lower abdomen tonight while at work. Around 0200 she went to the bathroom and noted a BBOW protruding from the vagina. She denies history of PTL or PTD. She states that she called for the other RN to help her and that she broke the bag. She denies significant pain now. States occasional lower abdominal pressure that comes and goes and mild cramping. She reports normal fetal movement.  OB History    Grav  Para  Term  Preterm  Abortions  TAB  SAB  Ect  Mult  Living    2  1  1        1       Past Medical History   Diagnosis  Date   .  Medical history non-contributory     Past Surgical History   Procedure  Laterality  Date   .  Vaginal delivery   2008     epsiotomy with delivery   .  No past surgeries      No family history on file.  History   Substance Use Topics   .  Smoking status:  Never Smoker   .  Smokeless tobacco:  Never Used   .  Alcohol Use:  No    Allergies: No Known Allergies  Prescriptions prior to admission   Medication  Sig  Dispense  Refill   .  Prenatal Vit-FePoly-FA-DHA (VITAFOL-ONE) 29-1-200 MG CAPS  Take 1 capsule by mouth daily before breakfast.  30 capsule  11    Review of Systems  Gastrointestinal: Negative for abdominal pain.  Genitourinary:  + LOF   Physical Exam   Blood pressure 115/54, pulse 76, temperature 98.2 F (36.8 C), temperature source Oral, resp. rate 18, height 5' 2.5" (1.588 m), weight 133 lb (60.328 kg), last menstrual period 09/29/2013.  Physical Exam  Constitutional: She is oriented to person, place, and time. She appears well-developed and well-nourished. No distress.  HENT:  Head: Normocephalic.  Cardiovascular: Normal rate.  Respiratory: Effort normal.  GI: Soft. She exhibits no distension and no  mass. There is no tenderness. There is no rebound and no guarding.  Neurological: She is alert and oriented to person, place, and time.  Skin: Skin is warm and dry. No erythema.  Psychiatric: She has a normal mood and affect.   Results for orders placed during the hospital encounter of 02/22/14 (from the past 24 hour(s))   POCT FERN TEST Status: None    Collection Time    02/22/14 3:17 AM   Result  Value  Ref Range    POCT Fern Test  Positive = ruptured amniotic membanes      Assessment and Plan   A:  SIUP at [redacted]w[redacted]d  PPROM with minimal residual fluid  P:  Admit to L&D

## 2014-02-24 MED ORDER — OXYCODONE-ACETAMINOPHEN 5-325 MG PO TABS: 1.0000 | ORAL_TABLET | ORAL | Status: DC | PRN

## 2014-02-24 MED ORDER — IBUPROFEN 800 MG PO TABS: 800.0000 mg | ORAL_TABLET | Freq: Three times a day (TID) | ORAL | Status: DC | PRN

## 2014-02-24 MED ORDER — IBUPROFEN 600 MG PO TABS: 600.0000 mg | ORAL_TABLET | Freq: Four times a day (QID) | ORAL | Status: DC | PRN

## 2014-02-24 NOTE — Progress Notes (Signed)
Patient called me into room to show me a clot she passed while using the bathroom. Clot was about golf ball size and from the appearance it looked like a placental fragment. Clot was saved in a blue pad in the patient bathroom. Bleeding overall for patient has been small. Will continue to monitor

## 2014-02-24 NOTE — Progress Notes (Signed)
Discharge teaching complete. Pt understood all information and did not have questions. Pt pushed in wheelchair and discharged home to family.

## 2014-02-24 NOTE — Progress Notes (Signed)
Post Partum Day 1 Subjective: no complaints  Objective: Blood pressure 107/62, pulse 73, temperature 98.4 F (36.9 C), temperature source Oral, resp. rate 18, height 5' 2.5" (1.588 m), weight 133 lb (60.328 kg), last menstrual period 09/29/2013, SpO2 100.00%.  Physical Exam:  General: alert and no distress Lochia: appropriate Uterine Fundus: firm Incision: none DVT Evaluation: No evidence of DVT seen on physical exam.   Recent Labs  02/22/14 0956  HGB 11.0*  HCT 32.0*    Assessment/Plan: Discharge home   LOS: 2 days   Lonzell Dorris A 02/24/2014, 8:35 AM

## 2014-02-24 NOTE — Discharge Summary (Signed)
Obstetric Discharge Summary Reason for Admission: rupture of membranes,  PPROM. Prenatal Procedures: ultrasound Intrapartum Procedures: spontaneous vaginal delivery Postpartum Procedures: none Complications-Operative and Postpartum: none Hemoglobin  Date Value Ref Range Status  02/22/2014 11.0* 12.0 - 15.0 g/dL Final     HCT  Date Value Ref Range Status  02/22/2014 32.0* 36.0 - 46.0 % Final    Physical Exam:  General: alert and no distress Lochia: appropriate Uterine Fundus: firm Incision: none DVT Evaluation: No evidence of DVT seen on physical exam.  Discharge Diagnoses: PPROM at 20 weeks.  Preterm NSVD.  Discharge Information: Date: 02/24/2014 Activity: pelvic rest Diet: routine Medications: PNV, Ibuprofen, Colace and Percocet Condition: stable Instructions: refer to practice specific booklet Discharge to: home Follow-up Information   Follow up with Flavia Bruss A, MD In 2 weeks.   Specialty:  Obstetrics and Gynecology   Contact information:   [address] [place] [address] [phone]       Newborn Data: Live born female  Birth Weight: 11.4 oz (323 g) APGAR: 0, 0  Home with mother.  [name] A 02/24/2014, 8:42 AM

## 2014-02-25 ENCOUNTER — Encounter: Payer: Self-pay | Admitting: *Deleted

## 2014-02-25 ENCOUNTER — Ambulatory Visit (HOSPITAL_COMMUNITY): Payer: Medicaid Other

## 2014-03-04 ENCOUNTER — Telehealth: Payer: Self-pay | Admitting: *Deleted

## 2014-03-04 ENCOUNTER — Encounter: Payer: Self-pay | Admitting: *Deleted

## 2014-03-04 NOTE — Telephone Encounter (Signed)
Patient states she was given a letter for her employer with a return to work day of April 08, 2014. Patient states her employer is unable to honor that. Patient is requesting a new return to work letter allowing her to return to work on March 14, 2014.   Consulted with Dr. Jodi Mourning and he approved the new return to work date.  Attempted to contact patient, unable to reach patient and left a message to inform patient that I would rewrite the letter and fax it to her employer. Patient advised to contact the office if she has any other questions or concerns.

## 2014-04-11 ENCOUNTER — Encounter (HOSPITAL_COMMUNITY): Payer: Self-pay

## 2014-06-10 NOTE — L&D Delivery Note (Signed)
Delivery Note This is a 34 year old G 2 P1 who was admitted for Active phase labor.. She progressed naturally to the second stage of labor.  She pushed for 30 min. At 10:39 PM a viable female was delivered via Vaginal, Spontaneous Delivery (Presentation: Right Occiput Anterior).  Infant placed on maternal abdomen.  Delayed cord clamping for 5 minutes.  APGAR: 8, 9; weight 6 lb 11.6 oz (3050 g).   A nuchal cord was not Identified.  Inspection revealed a 2nd degree laceration. The uterus was firm bleeding stable.  The repair was done under local lidocaine.   EBL was 250.    Placenta was sent to pathology for spiraling.   Mom and baby skin to skin following delivery. Left in stable condition.   Placenta status: Intact, Manual removal after Nitroglycerine with anesthesia and Dr. Ruthann Cancer present.  Cord: 3 vessels with spiraling.  Cord pH: N/A  Anesthesia: None  Episiotomy: None Lacerations: 2nd degree Suture Repair: 3.0 vicryl Est. Blood Loss (mL): 250  Mom to postpartum.  Baby to Couplet care / Skin to Skin.  Morene Crocker, CNM 02/03/2015, 12:37 AM

## 2014-06-29 ENCOUNTER — Other Ambulatory Visit (INDEPENDENT_AMBULATORY_CARE_PROVIDER_SITE_OTHER): Payer: Medicaid Other

## 2014-06-29 VITALS — BP 113/76 | HR 76 | Temp 98.1°F | Ht 62.5 in | Wt 126.0 lb

## 2014-06-29 DIAGNOSIS — Z3200 Encounter for pregnancy test, result unknown: Secondary | ICD-10-CM

## 2014-06-29 LAB — POCT URINE PREGNANCY: Preg Test, Ur: POSITIVE

## 2014-06-29 NOTE — Progress Notes (Unsigned)
Patient in the office for a pregnancy test. Patient states she had a positive home pregnancy test. Pregnancy Test in office is positive.   BP 113/76 mmHg  Pulse 76  Temp(Src) 98.1 F (36.7 C)  Ht 5' 2.5" (1.588 m)  Wt 126 lb (57.153 kg)  BMI 22.66 kg/m2  LMP 05/04/2014

## 2014-07-04 ENCOUNTER — Telehealth: Payer: Self-pay | Admitting: *Deleted

## 2014-07-04 NOTE — Telephone Encounter (Signed)
Pt called to office stating that she was to follow up with office regarding after hour call over the weekend.   Pt states that she has had some bleeding over the weekend and had called the after hours nurse.  Pt states that she has been having some spotting over the weekend.  Pt states that it was bright red when first started with some small clotting.  Pt states that it has since lightened and is more of a brownish color now.   Pt made aware that since she is early gestation that she needs to monitor this bleeding and contact office if it increases or begins to be more like a menstral cycle.  Pt made aware that since she is early that at this point there isn't a lot that can be done if this bleeding continues.  Pt advised to monitor and keep appt on 07-14-14.  Pt advised to contact office at any time if she has concerns or if bleeding continues/worsens.   Pt states understanding.

## 2014-07-14 ENCOUNTER — Ambulatory Visit (INDEPENDENT_AMBULATORY_CARE_PROVIDER_SITE_OTHER): Payer: Medicaid Other | Admitting: Obstetrics

## 2014-07-14 ENCOUNTER — Encounter: Payer: Self-pay | Admitting: Obstetrics

## 2014-07-14 VITALS — BP 115/78 | HR 83 | Temp 97.2°F | Wt 125.0 lb

## 2014-07-14 DIAGNOSIS — O4691 Antepartum hemorrhage, unspecified, first trimester: Secondary | ICD-10-CM

## 2014-07-14 DIAGNOSIS — O0991 Supervision of high risk pregnancy, unspecified, first trimester: Secondary | ICD-10-CM

## 2014-07-14 DIAGNOSIS — O209 Hemorrhage in early pregnancy, unspecified: Secondary | ICD-10-CM

## 2014-07-14 DIAGNOSIS — Z8751 Personal history of pre-term labor: Secondary | ICD-10-CM

## 2014-07-14 LAB — POCT URINALYSIS DIPSTICK
Bilirubin, UA: NEGATIVE
Glucose, UA: NEGATIVE
KETONES UA: NEGATIVE
Nitrite, UA: NEGATIVE
PH UA: 6
RBC UA: 50
Spec Grav, UA: 1.02
Urobilinogen, UA: NEGATIVE

## 2014-07-14 NOTE — Progress Notes (Signed)
Not seen by physician.  New OB nursing visit only.  Baltazar Najjar MD

## 2014-07-15 LAB — OBSTETRIC PANEL
ANTIBODY SCREEN: NEGATIVE
BASOS ABS: 0 10*3/uL (ref 0.0–0.1)
BASOS PCT: 0 % (ref 0–1)
EOS ABS: 0.1 10*3/uL (ref 0.0–0.7)
EOS PCT: 2 % (ref 0–5)
HEMATOCRIT: 37.2 % (ref 36.0–46.0)
HEMOGLOBIN: 12.2 g/dL (ref 12.0–15.0)
Hepatitis B Surface Ag: NEGATIVE
Lymphocytes Relative: 26 % (ref 12–46)
Lymphs Abs: 1.4 10*3/uL (ref 0.7–4.0)
MCH: 29.2 pg (ref 26.0–34.0)
MCHC: 32.8 g/dL (ref 30.0–36.0)
MCV: 89 fL (ref 78.0–100.0)
MPV: 10.6 fL (ref 8.6–12.4)
Monocytes Absolute: 0.4 10*3/uL (ref 0.1–1.0)
Monocytes Relative: 8 % (ref 3–12)
NEUTROS ABS: 3.3 10*3/uL (ref 1.7–7.7)
Neutrophils Relative %: 64 % (ref 43–77)
PLATELETS: 216 10*3/uL (ref 150–400)
RBC: 4.18 MIL/uL (ref 3.87–5.11)
RDW: 14.6 % (ref 11.5–15.5)
Rh Type: POSITIVE
Rubella: 22.8 Index — ABNORMAL HIGH (ref <0.90)
WBC: 5.2 10*3/uL (ref 4.0–10.5)

## 2014-07-15 LAB — VITAMIN D 25 HYDROXY (VIT D DEFICIENCY, FRACTURES): VIT D 25 HYDROXY: 27 ng/mL — AB (ref 30–100)

## 2014-07-15 LAB — HIV ANTIBODY (ROUTINE TESTING W REFLEX): HIV: NONREACTIVE

## 2014-07-15 LAB — VARICELLA ZOSTER ANTIBODY, IGG: Varicella IgG: 13.11 Index (ref <135.00)

## 2014-07-16 LAB — CULTURE, OB URINE
Colony Count: NO GROWTH
Organism ID, Bacteria: NO GROWTH

## 2014-07-18 LAB — HEMOGLOBINOPATHY EVALUATION
HEMOGLOBIN OTHER: 0 %
Hgb A2 Quant: 2.8 % (ref 2.2–3.2)
Hgb A: 97.2 % (ref 96.8–97.8)
Hgb F Quant: 0 % (ref 0.0–2.0)
Hgb S Quant: 0 %

## 2014-07-22 ENCOUNTER — Ambulatory Visit (HOSPITAL_COMMUNITY)
Admission: RE | Admit: 2014-07-22 | Discharge: 2014-07-22 | Disposition: A | Discharge status: Home / Self Care | Payer: Medicaid Other | Source: Ambulatory Visit | Attending: Obstetrics | Admitting: Obstetrics

## 2014-07-22 DIAGNOSIS — O209 Hemorrhage in early pregnancy, unspecified: Secondary | ICD-10-CM | POA: Diagnosis present

## 2014-07-22 DIAGNOSIS — Z3A11 11 weeks gestation of pregnancy: Secondary | ICD-10-CM | POA: Diagnosis not present

## 2014-07-22 DIAGNOSIS — O208 Other hemorrhage in early pregnancy: Secondary | ICD-10-CM | POA: Diagnosis not present

## 2014-07-28 ENCOUNTER — Ambulatory Visit (INDEPENDENT_AMBULATORY_CARE_PROVIDER_SITE_OTHER): Payer: Medicaid Other | Admitting: Obstetrics

## 2014-07-28 VITALS — BP 105/52 | HR 91 | Temp 98.0°F | Wt 124.0 lb

## 2014-07-28 DIAGNOSIS — Z3481 Encounter for supervision of other normal pregnancy, first trimester: Secondary | ICD-10-CM

## 2014-07-28 LAB — POCT URINALYSIS DIPSTICK
Blood, UA: NEGATIVE
GLUCOSE UA: NEGATIVE
Ketones, UA: NEGATIVE
LEUKOCYTES UA: NEGATIVE
Nitrite, UA: NEGATIVE
Protein, UA: NEGATIVE
Spec Grav, UA: 1.015
pH, UA: 6

## 2014-07-29 ENCOUNTER — Encounter: Payer: Self-pay | Admitting: Obstetrics

## 2014-07-29 NOTE — Progress Notes (Signed)
  Subjective:    [name] is a 34 y.o. female being seen today for her obstetrical visit. She is at [redacted]w[redacted]d gestation. Patient reports: no complaints.  Problem List Items Addressed This Visit    None    Visit Diagnoses    Encounter for supervision of other normal pregnancy in first trimester    -  Primary    Relevant Orders    POCT urinalysis dipstick (Completed)      Patient Active Problem List   Diagnosis Date Noted  . Preterm delivery, delivered 02/23/2014  . Preterm premature rupture of membranes (PPROM) with unknown onset of labor 02/22/2014  . 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 Vaginitis and vulvovaginitis, unspecified 11/05/2012    Objective:     BP 105/52 mmHg  Pulse 91  Temp(Src) 98 F (36.7 C)  Wt 124 lb (56.246 kg)  LMP 05/04/2014 Uterine Size: Below umbilicus     Assessment:    Pregnancy @ [redacted]w[redacted]d  weeks Doing well    Plan:    Problem list reviewed and updated. Labs reviewed.  Follow up in 4 weeks. FIRST/CF mutation testing/NIPT/QUAD SCREEN/fragile X/[name] population testing/Spinal muscular atrophy discussed: requested. Role of ultrasound in pregnancy discussed; fetal survey: requested. Amniocentesis discussed: not indicated.

## 2014-08-08 ENCOUNTER — Other Ambulatory Visit: Payer: Self-pay | Admitting: *Deleted

## 2014-08-08 DIAGNOSIS — Z3491 Encounter for supervision of normal pregnancy, unspecified, first trimester: Secondary | ICD-10-CM

## 2014-08-08 MED ORDER — PREPLUS 27-1 MG PO TABS: 1.0000 | ORAL_TABLET | Freq: Every day | ORAL | Status: DC

## 2014-08-25 ENCOUNTER — Ambulatory Visit (INDEPENDENT_AMBULATORY_CARE_PROVIDER_SITE_OTHER): Payer: Medicaid Other | Admitting: Certified Nurse Midwife

## 2014-08-25 VITALS — Wt 125.2 lb

## 2014-08-25 DIAGNOSIS — O0992 Supervision of high risk pregnancy, unspecified, second trimester: Secondary | ICD-10-CM

## 2014-08-25 LAB — POCT URINALYSIS DIPSTICK
Bilirubin, UA: NEGATIVE
GLUCOSE UA: NEGATIVE
KETONES UA: NEGATIVE
Leukocytes, UA: NEGATIVE
Nitrite, UA: NEGATIVE
PH UA: 6
PROTEIN UA: NEGATIVE
RBC UA: NEGATIVE
SPEC GRAV UA: 1.02
Urobilinogen, UA: NEGATIVE

## 2014-08-25 MED ORDER — VITAFOL-NANO 18-0.6-0.4 MG PO TABS: 1.0000 | ORAL_TABLET | Freq: Every day | ORAL | Status: DC

## 2014-08-25 NOTE — Progress Notes (Signed)
  Subjective:    [name] is a 34 y.o. female being seen today for her obstetrical visit. She is at [redacted]w[redacted]d gestation. Patient reports: no complaints.  Denies vaginal bleeding, or discharge.  Feels occasional fetal movement.  Low maternal weight gain, will monitor.  Potentially loosing pregnancy medicaid coverage in May, told to keep [place] updated.  Hx of PPROM last pregnancy in September 2015 that was a 21 week fetal demise.    Problem List Items Addressed This Visit    None    Visit Diagnoses    Supervision of high-risk pregnancy, second trimester    -  Primary    Relevant Medications    Prenatal-Fe Fum-Methf-FA w/o A (VITAFOL-NANO) 18-0.6-0.4 MG TABS    Other Relevant Orders    AFP, Quad Screen    US OB Comp + 14 Wk    POCT urinalysis dipstick (Completed)    Supervision of high risk pregnancy in second trimester        Relevant Orders    AMB referral to maternal fetal medicine      Patient Active Problem List   Diagnosis Date Noted  . Preterm delivery, delivered 02/23/2014  . Preterm premature rupture of membranes (PPROM) with unknown onset of labor 02/22/2014  . 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 Labs reviewed. Education and printed materials provided on subchorionic hemorrhage.   Follow up in 4 weeks. FIRST/CF mutation testing/NIPT/QUAD SCREEN/fragile X/[name] population testing/Spinal muscular atrophy discussed: ordered. Role of ultrasound in pregnancy discussed; fetal survey: ordered. Amniocentesis discussed: not indicated. 75% of 15 minute visit spent on counseling and coordination of care.

## 2014-08-26 LAB — AFP, QUAD SCREEN
AFP: 34.2 ng/mL
Age Alone: 1:338 {titer}
Curr Gest Age: 16.4 wks.days
Down Syndrome Scr Risk Est: 1:1250 {titer}
HCG, Total: 40.69 IU/mL
INH: 225.5 pg/mL
INTERPRETATION-AFP: NEGATIVE
MOM FOR AFP: 0.76
MoM for INH: 1.23
MoM for hCG: 0.91
Open Spina bifida: NEGATIVE
Tri 18 Scr Risk Est: NEGATIVE
UE3 MOM: 1.11
uE3 Value: 1.09 ng/mL

## 2014-09-09 ENCOUNTER — Other Ambulatory Visit: Payer: Self-pay | Admitting: Certified Nurse Midwife

## 2014-09-09 ENCOUNTER — Ambulatory Visit (HOSPITAL_COMMUNITY)
Admission: RE | Admit: 2014-09-09 | Discharge: 2014-09-09 | Disposition: A | Discharge status: Home / Self Care | Payer: Medicaid Other | Source: Ambulatory Visit | Attending: Obstetrics | Admitting: Obstetrics

## 2014-09-09 ENCOUNTER — Other Ambulatory Visit (HOSPITAL_COMMUNITY): Payer: Self-pay | Admitting: Maternal and Fetal Medicine

## 2014-09-09 ENCOUNTER — Encounter (HOSPITAL_COMMUNITY): Payer: Self-pay

## 2014-09-09 DIAGNOSIS — O09292 Supervision of pregnancy with other poor reproductive or obstetric history, second trimester: Secondary | ICD-10-CM | POA: Diagnosis not present

## 2014-09-09 DIAGNOSIS — Z3A18 18 weeks gestation of pregnancy: Secondary | ICD-10-CM | POA: Diagnosis not present

## 2014-09-09 DIAGNOSIS — O2620 Pregnancy care for patient with recurrent pregnancy loss, unspecified trimester: Secondary | ICD-10-CM | POA: Insufficient documentation

## 2014-09-09 DIAGNOSIS — O0992 Supervision of high risk pregnancy, unspecified, second trimester: Secondary | ICD-10-CM

## 2014-09-09 DIAGNOSIS — O09299 Supervision of pregnancy with other poor reproductive or obstetric history, unspecified trimester: Secondary | ICD-10-CM | POA: Insufficient documentation

## 2014-09-09 DIAGNOSIS — Z3689 Encounter for other specified antenatal screening: Secondary | ICD-10-CM | POA: Insufficient documentation

## 2014-09-09 DIAGNOSIS — Z36 Encounter for antenatal screening of mother: Secondary | ICD-10-CM | POA: Insufficient documentation

## 2014-09-09 NOTE — Progress Notes (Signed)
MATERNAL FETAL MEDICINE CONSULT  Patient Name: [name] Medical Record Number:  [id number] Date of Birth: [birth year] Requesting Physician Name:  [name], MD Date of Service: 09/09/2014  Chief Complaint Prior 21 week preterm delivery  History of Present Illness [name] was seen today secondary to prior preterm delivery at 21 weeks at the request of [name], MD.  The patient is a 34 y.o. G3P1101,at [redacted]w[redacted]d with an EDD of 02/08/2015.  She has a history of a prior term vaginal delivery at term followed by preterm labor, PPROM, and delivery of a previable infant at 21 weeks.  She has no symptoms or complaints at this time.  She denies vaginal bleeding, loss of fluid, or contractions.  Review of Systems Pertinent items are noted in HPI.  Patient History OB History  Gravida Para Term Preterm AB SAB TAB Ectopic Multiple Living  3 2 1 1      1     # Outcome Date GA Lbr Len/2nd Weight Sex Delivery Anes PTL Lv  3 Current           2 Preterm 02/23/14 [redacted]w[redacted]d  11.4 oz (0.323 kg) F Vag-Spont None  FD  1 Term 06/27/06 [redacted]w[redacted]d  6 lb (2.722 kg) F Vag-Spont None N Y      Past Medical History  Diagnosis Date  . Medical history non-contributory     Past Surgical History  Procedure Laterality Date  . Vaginal delivery  2008    epsiotomy with delivery  . No past surgeries      History   Social History  . Marital Status: Single    Spouse Name: N/A  . Number of Children: N/A  . Years of Education: N/A   Social History Main Topics  . Smoking status: Never Smoker   . Smokeless tobacco: Never Used  . Alcohol Use: No  . Drug Use: No  . Sexual Activity:    Partners: Male    Birth Control/ Protection: None   Other Topics Concern  . Not on file   Social History Narrative    Family History Neither [name] nor her husband has a family history of mental retardation, birth defects, or genetic diseases.  Physical Examination Filed  Vitals:   09/09/14 1207  BP: 110/64  Pulse: 66   General appearance - alert, well appearing, and in no distress Abdomen - soft, nontender, nondistended, no masses or organomegaly Extremities - no pedal edema noted  Assessment and Recommendations 1.  Prior preterm birth.  Although [name] wishes to consider this more, I recommend she receive weekly 17-OH progesterone injections continuing through 36 weeks.  In addition, she should have serial transvaginal cervical length measurements  (3.8 cm today) approximately every 2 weeks.  Measurements should be done weekly if evidence of cervical shortening is found.  If her cervical length should fall below 2.5 cm prior to 23 weeks of gestation cervical cerclage placement should be considered.  Should cervical shortening be found after viability, hospitalization could be considered especially if very remote from term and the patient lives far from a tertiary care center to ensure delivery of a severely preterm infant occurs at an appropriately equipped facility.  However, strict bedrest has not been proven to reduce risk of preterm delivery or prolong gestations and thus should be avoided.  However, activity restriction would be reasonable again especially if cervical shortening is seen very remote from term.   I spent 30 minutes with [name] today of which  50% was face-to-face counseling.  Thank you for referring [name] to the [facility].  Please do not hesitate to contact us with questions.   Jolyn Lent, MD

## 2014-09-14 ENCOUNTER — Other Ambulatory Visit: Payer: Medicaid Other

## 2014-09-14 ENCOUNTER — Encounter: Payer: Medicaid Other | Admitting: Certified Nurse Midwife

## 2014-09-15 ENCOUNTER — Ambulatory Visit (INDEPENDENT_AMBULATORY_CARE_PROVIDER_SITE_OTHER): Payer: Medicaid Other | Admitting: Certified Nurse Midwife

## 2014-09-15 VITALS — Temp 98.2°F | Wt 128.0 lb

## 2014-09-15 DIAGNOSIS — Z3482 Encounter for supervision of other normal pregnancy, second trimester: Secondary | ICD-10-CM

## 2014-09-15 LAB — POCT URINALYSIS DIPSTICK
BILIRUBIN UA: NEGATIVE
Blood, UA: NEGATIVE
Glucose, UA: NEGATIVE
Ketones, UA: NEGATIVE
Leukocytes, UA: NEGATIVE
Nitrite, UA: NEGATIVE
Protein, UA: NEGATIVE
SPEC GRAV UA: 1.015
Urobilinogen, UA: NEGATIVE
pH, UA: 5.5

## 2014-09-15 NOTE — Progress Notes (Signed)
Subjective:    [name] is a 34 y.o. female being seen today for her obstetrical visit. She is at [redacted]w[redacted]d gestation. Patient reports: no bleeding, no contractions, no cramping and no leaking . Fetal movement: normal.  Desired to discuss MFM consult from last week.  Patient does not like the label of high risk pregnancy, discussed 17-P at length and the importance.  She also discussed 17-P with Dr. Jodi Mourning.   Problem List Items Addressed This Visit    None    Visit Diagnoses    Encounter for supervision of other normal pregnancy in second trimester    -  Primary    Relevant Orders    POCT urinalysis dipstick (Completed)      Patient Active Problem List   Diagnosis Date Noted  . Encounter for fetal anatomic survey   . Type 1 diabetes mellitus in third trimester, antepartum   . Recurrent pregnancy loss, antepartum condition or complication   . Pregnancy with poor reproductive history   . [redacted] weeks gestation of pregnancy   . [redacted] weeks gestation of pregnancy   . Preterm delivery, delivered 02/23/2014  . Preterm premature rupture of membranes (PPROM) with unknown onset of labor 02/22/2014  . 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 Pelvic Rest Initiate 17-P injections  Plan:    OBGCT: discussed. Signs and symptoms of preterm labor: discussed. Ordered 17-P and discussed importance of weekly shots, continued appointments with MFM Discussed with Dr. Jodi Mourning about sexual intercourse with her husband and the importance of pelvic rest, along with 17-P and MFM consult.   Labs, problem list reviewed and updated 2 hr GTT planned Follow up in 4 weeks.

## 2014-09-22 ENCOUNTER — Ambulatory Visit (INDEPENDENT_AMBULATORY_CARE_PROVIDER_SITE_OTHER): Payer: Medicaid Other | Admitting: *Deleted

## 2014-09-22 ENCOUNTER — Ambulatory Visit: Payer: Medicaid Other

## 2014-09-22 VITALS — BP 104/69 | HR 64 | Temp 97.7°F | Wt 129.0 lb

## 2014-09-22 DIAGNOSIS — O0992 Supervision of high risk pregnancy, unspecified, second trimester: Secondary | ICD-10-CM | POA: Diagnosis not present

## 2014-09-23 ENCOUNTER — Ambulatory Visit (HOSPITAL_COMMUNITY)
Admission: RE | Admit: 2014-09-23 | Discharge: 2014-09-23 | Disposition: A | Discharge status: Home / Self Care | Payer: Medicaid Other | Source: Ambulatory Visit | Attending: Maternal and Fetal Medicine | Admitting: Maternal and Fetal Medicine

## 2014-09-23 ENCOUNTER — Other Ambulatory Visit (HOSPITAL_COMMUNITY): Payer: Self-pay | Admitting: Certified Nurse Midwife

## 2014-09-23 ENCOUNTER — Other Ambulatory Visit (HOSPITAL_COMMUNITY): Payer: Self-pay | Admitting: Maternal and Fetal Medicine

## 2014-09-23 ENCOUNTER — Other Ambulatory Visit: Payer: Self-pay | Admitting: Certified Nurse Midwife

## 2014-09-23 ENCOUNTER — Encounter (HOSPITAL_COMMUNITY): Payer: Self-pay

## 2014-09-23 VITALS — BP 118/69 | HR 69 | Wt 128.8 lb

## 2014-09-23 DIAGNOSIS — Z36 Encounter for antenatal screening of mother: Secondary | ICD-10-CM | POA: Diagnosis present

## 2014-09-23 DIAGNOSIS — O09292 Supervision of pregnancy with other poor reproductive or obstetric history, second trimester: Secondary | ICD-10-CM | POA: Diagnosis not present

## 2014-09-23 DIAGNOSIS — O3432 Maternal care for cervical incompetence, second trimester: Secondary | ICD-10-CM | POA: Insufficient documentation

## 2014-09-23 DIAGNOSIS — O42919 Preterm premature rupture of membranes, unspecified as to length of time between rupture and onset of labor, unspecified trimester: Secondary | ICD-10-CM

## 2014-09-23 DIAGNOSIS — Z3A20 20 weeks gestation of pregnancy: Secondary | ICD-10-CM | POA: Insufficient documentation

## 2014-09-23 DIAGNOSIS — O0992 Supervision of high risk pregnancy, unspecified, second trimester: Secondary | ICD-10-CM

## 2014-09-23 MED ORDER — HYDROXYPROGESTERONE CAPROATE 250 MG/ML IM OIL
250.0000 mg | TOPICAL_OIL | INTRAMUSCULAR | Status: AC
  Administered 2014-09-22 – 2015-01-04 (×15): 250 mg via INTRAMUSCULAR

## 2014-09-23 NOTE — Progress Notes (Signed)
Pt in office for 17P injections.  Pt tolerated injection well.  Pt has appt scheduled for next injection.  Pt has no other concerns today.   BP 104/69 mmHg  Pulse 64  Temp(Src) 97.7 F (36.5 C)  Wt 129 lb (58.514 kg)  LMP 05/04/2014

## 2014-09-29 ENCOUNTER — Ambulatory Visit (INDEPENDENT_AMBULATORY_CARE_PROVIDER_SITE_OTHER): Payer: Medicaid Other | Admitting: *Deleted

## 2014-09-29 DIAGNOSIS — O42919 Preterm premature rupture of membranes, unspecified as to length of time between rupture and onset of labor, unspecified trimester: Secondary | ICD-10-CM | POA: Diagnosis not present

## 2014-09-29 NOTE — Progress Notes (Signed)
Patient in office today for a 17-P injection. Patient is aware to come weekly for her injections. Patient tolerated injection well.   BP 105/69 mmHg  Pulse 77  Temp(Src) 97.9 F (36.6 C)  Wt 131 lb (59.421 kg)  LMP 05/04/2014   Administrations This Visit    hydroxyprogesterone caproate (DELALUTIN) 250 mg/mL injection 250 mg    Admin Date Action Dose Route Administered By         09/29/2014 Given 250 mg Intramuscular Carole Binning, LPN

## 2014-09-30 ENCOUNTER — Encounter (HOSPITAL_COMMUNITY): Payer: Self-pay

## 2014-09-30 ENCOUNTER — Ambulatory Visit (HOSPITAL_COMMUNITY)
Admission: RE | Admit: 2014-09-30 | Discharge: 2014-09-30 | Disposition: A | Discharge status: Home / Self Care | Payer: Medicaid Other | Source: Ambulatory Visit | Attending: Certified Nurse Midwife | Admitting: Certified Nurse Midwife

## 2014-09-30 DIAGNOSIS — Z36 Encounter for antenatal screening of mother: Secondary | ICD-10-CM | POA: Diagnosis not present

## 2014-09-30 DIAGNOSIS — O3432 Maternal care for cervical incompetence, second trimester: Secondary | ICD-10-CM | POA: Insufficient documentation

## 2014-09-30 DIAGNOSIS — O09292 Supervision of pregnancy with other poor reproductive or obstetric history, second trimester: Secondary | ICD-10-CM | POA: Insufficient documentation

## 2014-09-30 DIAGNOSIS — O0990 Supervision of high risk pregnancy, unspecified, unspecified trimester: Secondary | ICD-10-CM | POA: Insufficient documentation

## 2014-09-30 DIAGNOSIS — Z3A21 21 weeks gestation of pregnancy: Secondary | ICD-10-CM | POA: Insufficient documentation

## 2014-09-30 DIAGNOSIS — O0992 Supervision of high risk pregnancy, unspecified, second trimester: Secondary | ICD-10-CM

## 2014-10-06 ENCOUNTER — Ambulatory Visit (INDEPENDENT_AMBULATORY_CARE_PROVIDER_SITE_OTHER): Payer: Medicaid Other | Admitting: *Deleted

## 2014-10-06 DIAGNOSIS — O42919 Preterm premature rupture of membranes, unspecified as to length of time between rupture and onset of labor, unspecified trimester: Secondary | ICD-10-CM

## 2014-10-06 NOTE — Progress Notes (Signed)
Patient in office for weekly 17-P injection. Patient is aware to return in one week for another injection. Patient tolerated injection well. BP 104/63 mmHg  Pulse 74  Temp(Src) 98.3 F (36.8 C)  Wt 129 lb (58.514 kg)  LMP 05/04/2014  Administrations This Visit    hydroxyprogesterone caproate (DELALUTIN) 250 mg/mL injection 250 mg    Admin Date Action Dose Route Administered By         10/06/2014 Given 250 mg Intramuscular Carole Binning, LPN

## 2014-10-07 ENCOUNTER — Encounter (HOSPITAL_COMMUNITY): Payer: Self-pay

## 2014-10-07 ENCOUNTER — Other Ambulatory Visit (HOSPITAL_COMMUNITY): Payer: Self-pay | Admitting: Maternal and Fetal Medicine

## 2014-10-07 ENCOUNTER — Ambulatory Visit (HOSPITAL_COMMUNITY)
Admission: RE | Admit: 2014-10-07 | Discharge: 2014-10-07 | Disposition: A | Discharge status: Home / Self Care | Payer: Medicaid Other | Source: Ambulatory Visit | Attending: Obstetrics | Admitting: Obstetrics

## 2014-10-07 DIAGNOSIS — O3412 Maternal care for benign tumor of corpus uteri, second trimester: Secondary | ICD-10-CM | POA: Insufficient documentation

## 2014-10-07 DIAGNOSIS — O0992 Supervision of high risk pregnancy, unspecified, second trimester: Secondary | ICD-10-CM

## 2014-10-07 DIAGNOSIS — Z3A22 22 weeks gestation of pregnancy: Secondary | ICD-10-CM | POA: Diagnosis not present

## 2014-10-07 DIAGNOSIS — D259 Leiomyoma of uterus, unspecified: Secondary | ICD-10-CM | POA: Diagnosis not present

## 2014-10-07 DIAGNOSIS — O09292 Supervision of pregnancy with other poor reproductive or obstetric history, second trimester: Secondary | ICD-10-CM | POA: Insufficient documentation

## 2014-10-07 NOTE — ED Notes (Signed)
Pt requested that BP be taken again.  States she is concerned that the "top number is too high."  Explained that BP is within normal range.  Pt insisted on having repeat BP 107/56.

## 2014-10-13 ENCOUNTER — Encounter: Payer: Self-pay | Admitting: Obstetrics

## 2014-10-13 ENCOUNTER — Ambulatory Visit (INDEPENDENT_AMBULATORY_CARE_PROVIDER_SITE_OTHER): Payer: Medicaid Other | Admitting: Obstetrics

## 2014-10-13 VITALS — BP 106/62 | HR 80 | Temp 98.1°F | Wt 132.0 lb

## 2014-10-13 DIAGNOSIS — Z8751 Personal history of pre-term labor: Secondary | ICD-10-CM

## 2014-10-13 DIAGNOSIS — Z3482 Encounter for supervision of other normal pregnancy, second trimester: Secondary | ICD-10-CM

## 2014-10-13 DIAGNOSIS — O0992 Supervision of high risk pregnancy, unspecified, second trimester: Secondary | ICD-10-CM

## 2014-10-13 LAB — POCT URINALYSIS DIPSTICK
Bilirubin, UA: NEGATIVE
Blood, UA: NEGATIVE
CLARITY UA: NEGATIVE
Glucose, UA: NEGATIVE
KETONES UA: NEGATIVE
LEUKOCYTES UA: NEGATIVE
NITRITE UA: NEGATIVE
PH UA: 6
Protein, UA: NEGATIVE
Spec Grav, UA: 1.015
Urobilinogen, UA: NEGATIVE

## 2014-10-13 NOTE — Progress Notes (Signed)
Pt was given 17P injection at today's visit.  Pt tolerated well.  Pt has return visit for next injection.    Administrations This Visit    hydroxyprogesterone caproate (DELALUTIN) 250 mg/mL injection 250 mg    Admin Date Action Dose Route Administered By         10/13/2014 Given 250 mg Intramuscular Valene Bors, CMA

## 2014-10-13 NOTE — Progress Notes (Signed)
Subjective:    [name] is a 34 y.o. female being seen today for her obstetrical visit. She is at [redacted]w[redacted]d gestation. Patient reports no complaints. Fetal movement: normal.  Menstrual History: OB History    Gravida Para Term Preterm AB TAB SAB Ectopic Multiple Living   3 2 1 1      1        Patient's last menstrual period was 05/04/2014.    The following portions of the patient's history were reviewed and updated as appropriate: allergies, current medications, past family history, past medical history, past social history, past surgical history and problem list.  Review of Systems A comprehensive review of systems was negative.   Objective:    BP 106/62 mmHg  Pulse 80  Temp(Src) 98.1 F (36.7 C)  Wt 132 lb (59.875 kg)  LMP 05/04/2014 FHT: 150 BPM  Uterine Size: size equals dates     Assessment:    Pregnancy 23.1 weeks.  Doing well.  Plan:    OBGCT: ordered for next visit. Signs and symptoms of preterm labor: discussed. Follow up in 4 weeks.

## 2014-10-20 ENCOUNTER — Encounter: Payer: Self-pay | Admitting: Obstetrics

## 2014-10-20 ENCOUNTER — Encounter: Payer: Medicaid Other | Admitting: Obstetrics

## 2014-10-20 ENCOUNTER — Encounter (HOSPITAL_COMMUNITY): Payer: Self-pay

## 2014-10-20 ENCOUNTER — Ambulatory Visit (HOSPITAL_COMMUNITY)
Admission: RE | Admit: 2014-10-20 | Discharge: 2014-10-20 | Disposition: A | Discharge status: Home / Self Care | Payer: Medicaid Other | Source: Ambulatory Visit | Attending: Certified Nurse Midwife | Admitting: Certified Nurse Midwife

## 2014-10-20 ENCOUNTER — Ambulatory Visit (INDEPENDENT_AMBULATORY_CARE_PROVIDER_SITE_OTHER): Payer: Medicaid Other | Admitting: Obstetrics

## 2014-10-20 ENCOUNTER — Ambulatory Visit: Payer: Medicaid Other

## 2014-10-20 VITALS — BP 108/66 | HR 82 | Temp 97.1°F | Wt 133.0 lb

## 2014-10-20 VITALS — BP 99/54 | HR 86 | Wt 132.8 lb

## 2014-10-20 DIAGNOSIS — O09292 Supervision of pregnancy with other poor reproductive or obstetric history, second trimester: Secondary | ICD-10-CM

## 2014-10-20 DIAGNOSIS — O0992 Supervision of high risk pregnancy, unspecified, second trimester: Secondary | ICD-10-CM

## 2014-10-20 DIAGNOSIS — Z3A00 Weeks of gestation of pregnancy not specified: Secondary | ICD-10-CM | POA: Insufficient documentation

## 2014-10-20 DIAGNOSIS — O09892 Supervision of other high risk pregnancies, second trimester: Secondary | ICD-10-CM | POA: Diagnosis present

## 2014-10-20 LAB — POCT URINALYSIS DIPSTICK
Bilirubin, UA: NEGATIVE
Blood, UA: NEGATIVE
Glucose, UA: NEGATIVE
KETONES UA: NEGATIVE
LEUKOCYTES UA: NEGATIVE
Nitrite, UA: NEGATIVE
Protein, UA: NEGATIVE
Urobilinogen, UA: NEGATIVE
pH, UA: 8

## 2014-10-20 NOTE — Progress Notes (Signed)
Subjective:    [name] is a 34 y.o. female being seen today for her obstetrical visit. She is at [redacted]w[redacted]d gestation. Patient reports: no complaints . Fetal movement: normal.  Problem List Items Addressed This Visit    None    Visit Diagnoses    Supervision of high risk pregnancy in second trimester    -  Primary    Relevant Orders    POCT urinalysis dipstick (Completed)      Patient Active Problem List   Diagnosis Date Noted  . [redacted] weeks gestation of pregnancy   . Supervision of high-risk pregnancy   . [redacted] weeks gestation of pregnancy   . Prior poor obstetrical history in second trimester, antepartum   . [redacted] weeks gestation of pregnancy   . Encounter for fetal anatomic survey   . Type 1 diabetes mellitus in third trimester, antepartum   . Recurrent pregnancy loss, antepartum condition or complication   . Pregnancy with poor reproductive history   . [redacted] weeks gestation of pregnancy   . [redacted] weeks gestation of pregnancy   . Preterm delivery, delivered 02/23/2014  . Preterm premature rupture of membranes (PPROM) with unknown onset of labor 02/22/2014  . 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  Labs, problem list reviewed and updated 2 hr GTT planned Follow up in 1 weeks.

## 2014-10-21 ENCOUNTER — Ambulatory Visit (HOSPITAL_COMMUNITY): Payer: Medicaid Other

## 2014-10-27 ENCOUNTER — Ambulatory Visit (INDEPENDENT_AMBULATORY_CARE_PROVIDER_SITE_OTHER): Payer: Medicaid Other | Admitting: Obstetrics

## 2014-10-27 ENCOUNTER — Encounter: Payer: Self-pay | Admitting: Obstetrics

## 2014-10-27 VITALS — BP 102/62 | HR 74 | Temp 98.2°F

## 2014-10-27 DIAGNOSIS — Z3482 Encounter for supervision of other normal pregnancy, second trimester: Secondary | ICD-10-CM | POA: Diagnosis not present

## 2014-10-27 LAB — POCT URINALYSIS DIPSTICK
Bilirubin, UA: NEGATIVE
GLUCOSE UA: NEGATIVE
KETONES UA: NEGATIVE
Leukocytes, UA: NEGATIVE
Nitrite, UA: NEGATIVE
PH UA: 7
Protein, UA: NEGATIVE
RBC UA: NEGATIVE
SPEC GRAV UA: 1.01
Urobilinogen, UA: NEGATIVE

## 2014-10-27 NOTE — Progress Notes (Signed)
Subjective:    [name] is a 33 y.o. female being seen today for her obstetrical visit. She is at [redacted]w[redacted]d gestation. Patient reports: no complaints . Fetal movement: normal.  Problem List Items Addressed This Visit    None    Visit Diagnoses    Encounter for supervision of other normal pregnancy in second trimester    -  Primary    Relevant Orders    POCT urinalysis dipstick (Completed)      Patient Active Problem List   Diagnosis Date Noted  . [redacted] weeks gestation of pregnancy   . Supervision of high-risk pregnancy   . [redacted] weeks gestation of pregnancy   . Prior poor obstetrical history in second trimester, antepartum   . [redacted] weeks gestation of pregnancy   . Encounter for fetal anatomic survey   . Type 1 diabetes mellitus in third trimester, antepartum   . Recurrent pregnancy loss, antepartum condition or complication   . Pregnancy with poor reproductive history   . [redacted] weeks gestation of pregnancy   . [redacted] weeks gestation of pregnancy   . Preterm delivery, delivered 02/23/2014  . Preterm premature rupture of membranes (PPROM) with unknown onset of labor 02/22/2014  . 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  Labs, problem list reviewed and updated 2 hr GTT planned Follow up in 2 weeks.

## 2014-10-28 ENCOUNTER — Other Ambulatory Visit: Payer: Self-pay | Admitting: Obstetrics

## 2014-11-03 ENCOUNTER — Ambulatory Visit (INDEPENDENT_AMBULATORY_CARE_PROVIDER_SITE_OTHER): Payer: Medicaid Other | Admitting: *Deleted

## 2014-11-03 ENCOUNTER — Encounter (HOSPITAL_COMMUNITY): Payer: Self-pay

## 2014-11-03 ENCOUNTER — Other Ambulatory Visit: Payer: Self-pay | Admitting: Certified Nurse Midwife

## 2014-11-03 ENCOUNTER — Ambulatory Visit (HOSPITAL_COMMUNITY)
Admission: RE | Admit: 2014-11-03 | Discharge: 2014-11-03 | Disposition: A | Discharge status: Home / Self Care | Payer: Medicaid Other | Source: Ambulatory Visit | Attending: Certified Nurse Midwife | Admitting: Certified Nurse Midwife

## 2014-11-03 VITALS — BP 108/67 | HR 84 | Temp 98.0°F | Wt 132.0 lb

## 2014-11-03 DIAGNOSIS — O2692 Pregnancy related conditions, unspecified, second trimester: Secondary | ICD-10-CM | POA: Diagnosis not present

## 2014-11-03 DIAGNOSIS — O3412 Maternal care for benign tumor of corpus uteri, second trimester: Secondary | ICD-10-CM | POA: Insufficient documentation

## 2014-11-03 DIAGNOSIS — Z3A26 26 weeks gestation of pregnancy: Secondary | ICD-10-CM | POA: Insufficient documentation

## 2014-11-03 DIAGNOSIS — O09292 Supervision of pregnancy with other poor reproductive or obstetric history, second trimester: Secondary | ICD-10-CM | POA: Insufficient documentation

## 2014-11-03 DIAGNOSIS — O0992 Supervision of high risk pregnancy, unspecified, second trimester: Secondary | ICD-10-CM

## 2014-11-03 DIAGNOSIS — O3432 Maternal care for cervical incompetence, second trimester: Secondary | ICD-10-CM | POA: Diagnosis present

## 2014-11-03 DIAGNOSIS — Z3482 Encounter for supervision of other normal pregnancy, second trimester: Secondary | ICD-10-CM

## 2014-11-03 NOTE — Progress Notes (Signed)
Pt is in office today for 17p injection.  Injection given, pt tolerated well.  Pt has no other concerns today.  Pt has return appt scheduled next week.  BP 108/67 mmHg  Pulse 84  Temp(Src) 98 F (36.7 C)  Wt 132 lb (59.875 kg)  LMP 05/04/2014  Administrations This Visit    hydroxyprogesterone caproate (DELALUTIN) 250 mg/mL injection 250 mg    Admin Date Action Dose Route Administered By         11/03/2014 Given 250 mg Intramuscular Valene Bors, CMA

## 2014-11-04 ENCOUNTER — Ambulatory Visit (HOSPITAL_COMMUNITY): Payer: Medicaid Other

## 2014-11-08 ENCOUNTER — Encounter: Payer: Self-pay | Admitting: *Deleted

## 2014-11-10 ENCOUNTER — Encounter: Payer: Self-pay | Admitting: Obstetrics

## 2014-11-10 ENCOUNTER — Other Ambulatory Visit: Payer: Medicaid Other

## 2014-11-10 ENCOUNTER — Other Ambulatory Visit: Payer: Self-pay

## 2014-11-10 ENCOUNTER — Ambulatory Visit (INDEPENDENT_AMBULATORY_CARE_PROVIDER_SITE_OTHER): Payer: Medicaid Other | Admitting: Obstetrics

## 2014-11-10 VITALS — BP 101/70 | HR 82 | Temp 97.1°F | Wt 132.0 lb

## 2014-11-10 DIAGNOSIS — O09213 Supervision of pregnancy with history of pre-term labor, third trimester: Secondary | ICD-10-CM

## 2014-11-10 DIAGNOSIS — O0992 Supervision of high risk pregnancy, unspecified, second trimester: Secondary | ICD-10-CM

## 2014-11-10 LAB — POCT URINALYSIS DIPSTICK
Bilirubin, UA: NEGATIVE
Blood, UA: NEGATIVE
Glucose, UA: NEGATIVE
Ketones, UA: NEGATIVE
LEUKOCYTES UA: NEGATIVE
NITRITE UA: NEGATIVE
Protein, UA: NEGATIVE
SPEC GRAV UA: 1.015
Urobilinogen, UA: NEGATIVE
pH, UA: 7

## 2014-11-10 NOTE — Progress Notes (Signed)
Subjective:    [name] is a 34 y.o. female being seen today for her obstetrical visit. She is at [redacted]w[redacted]d gestation. Patient reports: no complaints . Fetal movement: normal.  Problem List Items Addressed This Visit    None    Visit Diagnoses    Supervision of high risk pregnancy in second trimester    -  Primary    Relevant Orders    POCT urinalysis dipstick (Completed)      Patient Active Problem List   Diagnosis Date Noted  . [redacted] weeks gestation of pregnancy   . Supervision of high-risk pregnancy   . [redacted] weeks gestation of pregnancy   . Prior poor obstetrical history in second trimester, antepartum   . [redacted] weeks gestation of pregnancy   . Encounter for fetal anatomic survey   . Type 1 diabetes mellitus in third trimester, antepartum   . Recurrent pregnancy loss, antepartum condition or complication   . Pregnancy with poor reproductive history   . [redacted] weeks gestation of pregnancy   . [redacted] weeks gestation of pregnancy   . Preterm delivery, delivered 02/23/2014  . Preterm premature rupture of membranes (PPROM) with unknown onset of labor 02/22/2014  . 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  Labs, problem list reviewed and updated 2 hr GTT planned Follow up in 1 weeks.

## 2014-11-11 ENCOUNTER — Other Ambulatory Visit: Payer: Medicaid Other

## 2014-11-11 DIAGNOSIS — Z3482 Encounter for supervision of other normal pregnancy, second trimester: Secondary | ICD-10-CM

## 2014-11-11 LAB — CBC
HCT: 32.5 % — ABNORMAL LOW (ref 36.0–46.0)
HEMOGLOBIN: 10.8 g/dL — AB (ref 12.0–15.0)
MCH: 30.2 pg (ref 26.0–34.0)
MCHC: 33.2 g/dL (ref 30.0–36.0)
MCV: 90.8 fL (ref 78.0–100.0)
MPV: 10.9 fL (ref 8.6–12.4)
PLATELETS: 183 10*3/uL (ref 150–400)
RBC: 3.58 MIL/uL — AB (ref 3.87–5.11)
RDW: 13.1 % (ref 11.5–15.5)
WBC: 7.6 10*3/uL (ref 4.0–10.5)

## 2014-11-12 LAB — RPR

## 2014-11-12 LAB — GLUCOSE TOLERANCE, 2 HOURS W/ 1HR
GLUCOSE, 2 HOUR: 120 mg/dL (ref 70–139)
GLUCOSE, FASTING: 52 mg/dL — AB (ref 70–99)
Glucose, 1 hour: 139 mg/dL (ref 70–170)

## 2014-11-12 LAB — HIV ANTIBODY (ROUTINE TESTING W REFLEX): HIV 1&2 Ab, 4th Generation: NONREACTIVE

## 2014-11-24 ENCOUNTER — Ambulatory Visit (INDEPENDENT_AMBULATORY_CARE_PROVIDER_SITE_OTHER): Payer: Medicaid Other | Admitting: Obstetrics

## 2014-11-24 VITALS — BP 96/66 | HR 77 | Temp 97.5°F | Wt 140.0 lb

## 2014-11-24 DIAGNOSIS — O0993 Supervision of high risk pregnancy, unspecified, third trimester: Secondary | ICD-10-CM

## 2014-11-24 LAB — POCT URINALYSIS DIPSTICK
Bilirubin, UA: NEGATIVE
Blood, UA: NEGATIVE
Glucose, UA: NEGATIVE
Ketones, UA: NEGATIVE
Leukocytes, UA: NEGATIVE
Nitrite, UA: NEGATIVE
Protein, UA: NEGATIVE
Urobilinogen, UA: NEGATIVE
pH, UA: 7

## 2014-11-25 ENCOUNTER — Encounter: Payer: Self-pay | Admitting: Obstetrics

## 2014-11-25 NOTE — Progress Notes (Signed)
Subjective:    [name] is a 34 y.o. female being seen today for her obstetrical visit. She is at [redacted]w[redacted]d gestation. Patient reports no complaints. Fetal movement: normal.  Problem List Items Addressed This Visit    None    Visit Diagnoses    Supervision of high risk pregnancy in third trimester    -  Primary    Relevant Orders    POCT urinalysis dipstick (Completed)      Patient Active Problem List   Diagnosis Date Noted  . [redacted] weeks gestation of pregnancy   . Supervision of high-risk pregnancy   . [redacted] weeks gestation of pregnancy   . Prior poor obstetrical history in second trimester, antepartum   . [redacted] weeks gestation of pregnancy   . Encounter for fetal anatomic survey   . Type 1 diabetes mellitus in third trimester, antepartum   . Recurrent pregnancy loss, antepartum condition or complication   . Pregnancy with poor reproductive history   . [redacted] weeks gestation of pregnancy   . [redacted] weeks gestation of pregnancy   . Preterm delivery, delivered 02/23/2014  . Preterm premature rupture of membranes (PPROM) with unknown onset of labor 02/22/2014  . 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 GBS sent TDAP offered  Rhogam given for RH negative Pediatrician: discussed. Infant feeding: plans to breastfeed. Maternity leave: discussed. Cigarette smoking: never smoked. Orders Placed This Encounter  Procedures  . POCT urinalysis dipstick   No orders of the defined types were placed in this encounter.   Follow up in 2 Weeks.

## 2014-12-01 ENCOUNTER — Ambulatory Visit (INDEPENDENT_AMBULATORY_CARE_PROVIDER_SITE_OTHER): Payer: Medicaid Other | Admitting: *Deleted

## 2014-12-01 ENCOUNTER — Encounter: Payer: Self-pay | Admitting: *Deleted

## 2014-12-01 VITALS — BP 104/60 | HR 81 | Temp 98.1°F

## 2014-12-01 DIAGNOSIS — O2692 Pregnancy related conditions, unspecified, second trimester: Secondary | ICD-10-CM

## 2014-12-01 DIAGNOSIS — O0993 Supervision of high risk pregnancy, unspecified, third trimester: Secondary | ICD-10-CM

## 2014-12-01 NOTE — Progress Notes (Signed)
Pt is in office today for 17p injection.  Pt states that she is doing well.  Pt tolerated injection well.  Pt has return appts scheduled.   Pt ask to speak with Dr Jodi Mourning while in office today.  Pt states that she is needing to discuss her work conditions with him.  Pt was taken to Dr Jodi Mourning office to discuss possible note for work.  Note was composed and sent with pt today.  Pt advised to make office aware of any other problems. Pt states understanding.  BP 104/60 mmHg  Pulse 81  Temp(Src) 98.1 F (36.7 C)  LMP 05/04/2014  Administrations This Visit    hydroxyprogesterone caproate (DELALUTIN) 250 mg/mL injection 250 mg    Admin Date Action Dose Route Administered By         12/01/2014 Given 250 mg Intramuscular Valene Bors, CMA

## 2014-12-08 ENCOUNTER — Ambulatory Visit (INDEPENDENT_AMBULATORY_CARE_PROVIDER_SITE_OTHER): Payer: Medicaid Other | Admitting: Obstetrics

## 2014-12-08 ENCOUNTER — Encounter: Payer: Medicaid Other | Admitting: Obstetrics

## 2014-12-08 ENCOUNTER — Encounter: Payer: Self-pay | Admitting: Obstetrics

## 2014-12-08 VITALS — BP 111/62 | HR 80 | Temp 98.2°F | Wt 137.0 lb

## 2014-12-08 DIAGNOSIS — Z3493 Encounter for supervision of normal pregnancy, unspecified, third trimester: Secondary | ICD-10-CM

## 2014-12-08 LAB — POCT URINALYSIS DIPSTICK
Bilirubin, UA: NEGATIVE
Blood, UA: NEGATIVE
Glucose, UA: 50
Ketones, UA: NEGATIVE
Nitrite, UA: NEGATIVE
Protein, UA: NEGATIVE
SPEC GRAV UA: 1.01
Urobilinogen, UA: NEGATIVE
pH, UA: 7

## 2014-12-08 NOTE — Progress Notes (Signed)
Subjective:    [name] is a 34 y.o. female being seen today for her obstetrical visit. She is at [redacted]w[redacted]d gestation. Patient reports no complaints. Fetal movement: normal.  Problem List Items Addressed This Visit    None     Patient Active Problem List   Diagnosis Date Noted  . [redacted] weeks gestation of pregnancy   . Supervision of high-risk pregnancy   . [redacted] weeks gestation of pregnancy   . Prior poor obstetrical history in second trimester, antepartum   . [redacted] weeks gestation of pregnancy   . Encounter for fetal anatomic survey   . Type 1 diabetes mellitus in third trimester, antepartum   . Recurrent pregnancy loss, antepartum condition or complication   . Pregnancy with poor reproductive history   . [redacted] weeks gestation of pregnancy   . [redacted] weeks gestation of pregnancy   . Preterm delivery, delivered 02/23/2014  . Preterm premature rupture of membranes (PPROM) with unknown onset of labor 02/22/2014  . 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 GBS sent TDAP offered  Rhogam given for RH negative Pediatrician: discussed. Infant feeding: plans to breastfeed. Maternity leave: discussed. Cigarette smoking: never smoked. No orders of the defined types were placed in this encounter.   No orders of the defined types were placed in this encounter.   Follow up in 1 Week.

## 2014-12-15 ENCOUNTER — Ambulatory Visit: Payer: Medicaid Other

## 2014-12-15 ENCOUNTER — Ambulatory Visit (INDEPENDENT_AMBULATORY_CARE_PROVIDER_SITE_OTHER): Payer: Medicaid Other | Admitting: *Deleted

## 2014-12-15 VITALS — BP 106/66 | HR 78 | Temp 97.8°F

## 2014-12-15 DIAGNOSIS — O0992 Supervision of high risk pregnancy, unspecified, second trimester: Secondary | ICD-10-CM | POA: Diagnosis not present

## 2014-12-15 DIAGNOSIS — O0993 Supervision of high risk pregnancy, unspecified, third trimester: Secondary | ICD-10-CM

## 2014-12-15 NOTE — Progress Notes (Signed)
Pt is in office today for 17p injection.  Injection was given, pt tolerated well.  Pt is concerned today about her job and working hours.  Pt states that her employer has offered her a change in work hours due to pregnancy concerns.  Pt is concerned that this will not be enough hours for her. Pt advised to stay positive and let office know if she has any needs concerning work conditions. Pt states understanding.   BP 106/66 mmHg  Pulse 78  Temp(Src) 97.8 F (36.6 C)  LMP 05/04/2014  Administrations This Visit    hydroxyprogesterone caproate (DELALUTIN) 250 mg/mL injection 250 mg    Admin Date Action Dose Route Administered By         12/15/2014 Given 250 mg Intramuscular Valene Bors, CMA

## 2014-12-22 ENCOUNTER — Encounter: Payer: Medicaid Other | Admitting: Obstetrics

## 2014-12-23 ENCOUNTER — Ambulatory Visit: Payer: Medicaid Other

## 2014-12-23 VITALS — BP 100/67 | HR 80 | Wt 137.6 lb

## 2014-12-23 DIAGNOSIS — O0993 Supervision of high risk pregnancy, unspecified, third trimester: Secondary | ICD-10-CM

## 2014-12-23 NOTE — Progress Notes (Unsigned)
Pt is in office today for 17p injection.  Pt tolerated injection well.  Pt has appt with Dr Jodi Mourning on Monday 12-26-14.  Pt has no other concerns today.  BP 100/67 mmHg  Pulse 80  Wt 137 lb 9.6 oz (62.415 kg)  LMP 05/04/2014  Administrations This Visit    hydroxyprogesterone caproate (DELALUTIN) 250 mg/mL injection 250 mg    Admin Date Action Dose Route Administered By         12/23/2014 Given 250 mg Intramuscular Valene Bors, CMA

## 2014-12-26 ENCOUNTER — Ambulatory Visit (INDEPENDENT_AMBULATORY_CARE_PROVIDER_SITE_OTHER): Payer: Medicaid Other | Admitting: Obstetrics

## 2014-12-26 VITALS — BP 102/60 | HR 82 | Temp 98.1°F | Wt 138.0 lb

## 2014-12-26 DIAGNOSIS — Z3483 Encounter for supervision of other normal pregnancy, third trimester: Secondary | ICD-10-CM

## 2014-12-26 LAB — POCT URINALYSIS DIPSTICK
Blood, UA: NEGATIVE
Glucose, UA: NEGATIVE
Ketones, UA: NEGATIVE
Leukocytes, UA: NEGATIVE
NITRITE UA: NEGATIVE
PH UA: 5
PROTEIN UA: NEGATIVE
Spec Grav, UA: 1.01

## 2014-12-27 ENCOUNTER — Encounter: Payer: Self-pay | Admitting: Obstetrics

## 2014-12-27 NOTE — Progress Notes (Signed)
Subjective:    [name] is a 34 y.o. female being seen today for her obstetrical visit. She is at [redacted]w[redacted]d gestation. Patient reports no complaints. Fetal movement: normal.  Problem List Items Addressed This Visit    None    Visit Diagnoses    Encounter for supervision of other normal pregnancy in third trimester    -  Primary    Relevant Orders    POCT urinalysis dipstick (Completed)      Patient Active Problem List   Diagnosis Date Noted  . [redacted] weeks gestation of pregnancy   . Supervision of high-risk pregnancy   . [redacted] weeks gestation of pregnancy   . Prior poor obstetrical history in second trimester, antepartum   . [redacted] weeks gestation of pregnancy   . Encounter for fetal anatomic survey   . Type 1 diabetes mellitus in third trimester, antepartum   . Recurrent pregnancy loss, antepartum condition or complication   . Pregnancy with poor reproductive history   . [redacted] weeks gestation of pregnancy   . [redacted] weeks gestation of pregnancy   . Preterm delivery, delivered 02/23/2014  . Preterm premature rupture of membranes (PPROM) with unknown onset of labor 02/22/2014  . 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 GBS sent TDAP offered  Rhogam given for RH negative Pediatrician: discussed. Infant feeding: plans to breastfeed. Maternity leave: discussed. Cigarette smoking: never smoked. Orders Placed This Encounter  Procedures  . POCT urinalysis dipstick   No orders of the defined types were placed in this encounter.   Follow up in 1 Week.

## 2014-12-29 ENCOUNTER — Ambulatory Visit (INDEPENDENT_AMBULATORY_CARE_PROVIDER_SITE_OTHER): Payer: Medicaid Other | Admitting: *Deleted

## 2014-12-29 ENCOUNTER — Ambulatory Visit: Payer: Medicaid Other

## 2014-12-29 VITALS — BP 110/64 | HR 73 | Temp 97.6°F | Wt 141.4 lb

## 2014-12-29 DIAGNOSIS — Z3483 Encounter for supervision of other normal pregnancy, third trimester: Secondary | ICD-10-CM

## 2014-12-29 DIAGNOSIS — O0992 Supervision of high risk pregnancy, unspecified, second trimester: Secondary | ICD-10-CM

## 2014-12-29 NOTE — Progress Notes (Signed)
Pt is in office for 17p injection. Pt tolerated injection well.  Pt has return appt for next week.  Pt has no other concerns today.  BP 110/64 mmHg  Pulse 73  Temp(Src) 97.6 F (36.4 C)  Wt 141 lb 6.4 oz (64.139 kg)  LMP 05/04/2014  Administrations This Visit    hydroxyprogesterone caproate (DELALUTIN) 250 mg/mL injection 250 mg    Admin Date Action Dose Route Administered By         12/29/2014 Given 250 mg Intramuscular Valene Bors, CMA

## 2015-01-04 ENCOUNTER — Encounter: Payer: Self-pay | Admitting: Obstetrics

## 2015-01-04 ENCOUNTER — Ambulatory Visit (INDEPENDENT_AMBULATORY_CARE_PROVIDER_SITE_OTHER): Payer: Medicaid Other | Admitting: Obstetrics

## 2015-01-04 VITALS — BP 103/68 | HR 72 | Temp 97.0°F | Wt 141.0 lb

## 2015-01-04 DIAGNOSIS — Z3483 Encounter for supervision of other normal pregnancy, third trimester: Secondary | ICD-10-CM

## 2015-01-04 DIAGNOSIS — O0993 Supervision of high risk pregnancy, unspecified, third trimester: Secondary | ICD-10-CM

## 2015-01-04 DIAGNOSIS — Z8751 Personal history of pre-term labor: Secondary | ICD-10-CM

## 2015-01-04 LAB — POCT URINALYSIS DIPSTICK
Bilirubin, UA: NEGATIVE
Blood, UA: NEGATIVE
GLUCOSE UA: NEGATIVE
Ketones, UA: NEGATIVE
Leukocytes, UA: NEGATIVE
Nitrite, UA: NEGATIVE
Protein, UA: NEGATIVE
Spec Grav, UA: 1.01
UROBILINOGEN UA: NEGATIVE
pH, UA: 7

## 2015-01-04 NOTE — Progress Notes (Signed)
Subjective:    [name] is a 34 y.o. female being seen today for her obstetrical visit. She is at [redacted]w[redacted]d gestation. Patient reports no complaints. Fetal movement: normal.  Problem List Items Addressed This Visit    None     Patient Active Problem List   Diagnosis Date Noted  . [redacted] weeks gestation of pregnancy   . Supervision of high-risk pregnancy   . [redacted] weeks gestation of pregnancy   . Prior poor obstetrical history in second trimester, antepartum   . [redacted] weeks gestation of pregnancy   . Encounter for fetal anatomic survey   . Type 1 diabetes mellitus in third trimester, antepartum   . Recurrent pregnancy loss, antepartum condition or complication   . Pregnancy with poor reproductive history   . [redacted] weeks gestation of pregnancy   . [redacted] weeks gestation of pregnancy   . Preterm delivery, delivered 02/23/2014  . Preterm premature rupture of membranes (PPROM) with unknown onset of labor 02/22/2014  . 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 GBS sent TDAP offered  Rhogam given for RH negative Pediatrician: discussed. Infant feeding: plans to breastfeed. Maternity leave: discussed. Cigarette smoking: never smoked. No orders of the defined types were placed in this encounter.   No orders of the defined types were placed in this encounter.   Follow up in 1 Week.

## 2015-01-04 NOTE — Addendum Note (Signed)
Addended by: Carole Binning on: 01/04/2015 04:59 PM   Modules accepted: Orders

## 2015-01-04 NOTE — Addendum Note (Signed)
Addended by: Lewie Loron D on: 01/04/2015 05:12 PM   Modules accepted: Orders

## 2015-01-05 ENCOUNTER — Encounter: Payer: Medicaid Other | Admitting: Obstetrics

## 2015-01-05 LAB — STREP B DNA PROBE: GBSP: NOT DETECTED

## 2015-01-11 ENCOUNTER — Ambulatory Visit (INDEPENDENT_AMBULATORY_CARE_PROVIDER_SITE_OTHER): Payer: Medicaid Other | Admitting: Obstetrics

## 2015-01-11 VITALS — BP 104/65 | HR 75 | Temp 98.1°F | Wt 142.0 lb

## 2015-01-11 DIAGNOSIS — O0993 Supervision of high risk pregnancy, unspecified, third trimester: Secondary | ICD-10-CM | POA: Diagnosis not present

## 2015-01-11 DIAGNOSIS — O09213 Supervision of pregnancy with history of pre-term labor, third trimester: Secondary | ICD-10-CM

## 2015-01-11 LAB — POCT URINALYSIS DIPSTICK
BILIRUBIN UA: NEGATIVE
Ketones, UA: NEGATIVE
LEUKOCYTES UA: NEGATIVE
Nitrite, UA: NEGATIVE
PROTEIN UA: NEGATIVE
RBC UA: NEGATIVE
Spec Grav, UA: <=1.005
Urobilinogen, UA: NEGATIVE
pH, UA: 6

## 2015-01-11 MED ORDER — HYDROXYPROGESTERONE CAPROATE 250 MG/ML IM OIL
250.0000 mg | TOPICAL_OIL | Freq: Once | INTRAMUSCULAR | Status: AC
  Administered 2015-01-11: 250 mg via INTRAMUSCULAR

## 2015-01-12 ENCOUNTER — Encounter: Payer: Self-pay | Admitting: Obstetrics

## 2015-01-12 NOTE — Progress Notes (Signed)
Subjective:    [name] is a 34 y.o. female being seen today for her obstetrical visit. She is at [redacted]w[redacted]d gestation. Patient reports no complaints. Fetal movement: normal.  Problem List Items Addressed This Visit    None    Visit Diagnoses    Supervision of high risk pregnancy in third trimester    -  Primary    Relevant Orders    POCT urinalysis dipstick (Completed)    High risk pregnancy due to history of preterm labor, third trimester        Relevant Medications    hydroxyprogesterone caproate (DELALUTIN) 250 mg/mL injection 250 mg (Completed)      Patient Active Problem List   Diagnosis Date Noted  . [redacted] weeks gestation of pregnancy   . Supervision of high-risk pregnancy   . [redacted] weeks gestation of pregnancy   . Prior poor obstetrical history in second trimester, antepartum   . [redacted] weeks gestation of pregnancy   . Encounter for fetal anatomic survey   . Type 1 diabetes mellitus in third trimester, antepartum   . Recurrent pregnancy loss, antepartum condition or complication   . Pregnancy with poor reproductive history   . [redacted] weeks gestation of pregnancy   . [redacted] weeks gestation of pregnancy   . Preterm delivery, delivered 02/23/2014  . Preterm premature rupture of membranes (PPROM) with unknown onset of labor 02/22/2014  . 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 GBS sent TDAP offered  Rhogam given for RH negative Pediatrician: discussed. Infant feeding: plans to breastfeed. Maternity leave: discussed. Cigarette smoking: never smoked. Orders Placed This Encounter  Procedures  . POCT urinalysis dipstick   Meds ordered this encounter  Medications  .  hydroxyprogesterone caproate (DELALUTIN) 250 mg/mL injection 250 mg    Sig:    Follow up in 1 Week.

## 2015-01-18 ENCOUNTER — Ambulatory Visit (INDEPENDENT_AMBULATORY_CARE_PROVIDER_SITE_OTHER): Payer: Medicaid Other | Admitting: Obstetrics

## 2015-01-18 ENCOUNTER — Encounter: Payer: Self-pay | Admitting: Obstetrics

## 2015-01-18 VITALS — BP 110/73 | HR 84 | Temp 97.7°F | Wt 142.2 lb

## 2015-01-18 DIAGNOSIS — Z3483 Encounter for supervision of other normal pregnancy, third trimester: Secondary | ICD-10-CM

## 2015-01-18 LAB — POCT URINALYSIS DIPSTICK
BILIRUBIN UA: NEGATIVE
Blood, UA: NEGATIVE
GLUCOSE UA: NEGATIVE
Ketones, UA: NEGATIVE
Leukocytes, UA: NEGATIVE
Nitrite, UA: NEGATIVE
Protein, UA: NEGATIVE
UROBILINOGEN UA: NEGATIVE
pH, UA: 6

## 2015-01-19 ENCOUNTER — Encounter: Payer: Self-pay | Admitting: Obstetrics

## 2015-01-19 NOTE — Progress Notes (Signed)
Subjective:    [name] is a 34 y.o. female being seen today for her obstetrical visit. She is at [redacted]w[redacted]d gestation. Patient reports no complaints. Fetal movement: normal.  Problem List Items Addressed This Visit    None    Visit Diagnoses    Encounter for supervision of other normal pregnancy in third trimester    -  Primary    Relevant Orders    POCT urinalysis dipstick (Completed)      Patient Active Problem List   Diagnosis Date Noted  . [redacted] weeks gestation of pregnancy   . Supervision of high-risk pregnancy   . [redacted] weeks gestation of pregnancy   . Prior poor obstetrical history in second trimester, antepartum   . [redacted] weeks gestation of pregnancy   . Encounter for fetal anatomic survey   . Type 1 diabetes mellitus in third trimester, antepartum   . Recurrent pregnancy loss, antepartum condition or complication   . Pregnancy with poor reproductive history   . [redacted] weeks gestation of pregnancy   . [redacted] weeks gestation of pregnancy   . Preterm delivery, delivered 02/23/2014  . Preterm premature rupture of membranes (PPROM) with unknown onset of labor 02/22/2014  . 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 Infant feeding: plans to breastfeed. Cigarette smoking: never smoked. L&D discussion: symptoms of labor, discussed when to call, discussed what number to call, anesthetic/analgesic options reviewed and delivering clinician:  plans no preference. Postpartum supports and preparation: circumcision discussed and contraception plans discussed.  Follow up in 1 Week.

## 2015-01-25 ENCOUNTER — Ambulatory Visit (INDEPENDENT_AMBULATORY_CARE_PROVIDER_SITE_OTHER): Payer: Medicaid Other | Admitting: Obstetrics

## 2015-01-25 VITALS — BP 113/74 | HR 78 | Temp 98.1°F | Wt 144.3 lb

## 2015-01-25 DIAGNOSIS — Z3483 Encounter for supervision of other normal pregnancy, third trimester: Secondary | ICD-10-CM

## 2015-01-25 LAB — POCT URINALYSIS DIPSTICK
Bilirubin, UA: NEGATIVE
Blood, UA: NEGATIVE
GLUCOSE UA: NORMAL
KETONES UA: NEGATIVE
Nitrite, UA: NEGATIVE
PROTEIN UA: NEGATIVE
SPEC GRAV UA: 1.01
Urobilinogen, UA: NEGATIVE
pH, UA: 6.5

## 2015-01-26 ENCOUNTER — Encounter: Payer: Self-pay | Admitting: Obstetrics

## 2015-01-26 NOTE — Progress Notes (Signed)
Subjective:    [name] is a 34 y.o. female being seen today for her obstetrical visit. She is at [redacted]w[redacted]d gestation. Patient reports no complaints. Fetal movement: normal.  Problem List Items Addressed This Visit    None    Visit Diagnoses    Encounter for supervision of other normal pregnancy in third trimester    -  Primary    Relevant Orders    POCT urinalysis dipstick (Completed)      Patient Active Problem List   Diagnosis Date Noted  . [redacted] weeks gestation of pregnancy   . Supervision of high-risk pregnancy   . [redacted] weeks gestation of pregnancy   . Prior poor obstetrical history in second trimester, antepartum   . [redacted] weeks gestation of pregnancy   . Encounter for fetal anatomic survey   . Type 1 diabetes mellitus in third trimester, antepartum   . Recurrent pregnancy loss, antepartum condition or complication   . Pregnancy with poor reproductive history   . [redacted] weeks gestation of pregnancy   . [redacted] weeks gestation of pregnancy   . Preterm delivery, delivered 02/23/2014  . Preterm premature rupture of membranes (PPROM) with unknown onset of labor 02/22/2014  . 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 Infant feeding: plans to breastfeed. Cigarette smoking: never smoked. L&D discussion: symptoms of labor, discussed when to call, discussed what number to call, anesthetic/analgesic options reviewed and delivering clinician:  plans no preference. Postpartum supports and preparation: circumcision discussed and contraception plans discussed.  Follow up in 1 Week.

## 2015-02-01 ENCOUNTER — Ambulatory Visit (INDEPENDENT_AMBULATORY_CARE_PROVIDER_SITE_OTHER): Payer: Medicaid Other | Admitting: Obstetrics

## 2015-02-01 VITALS — BP 112/70 | HR 71 | Temp 98.1°F | Wt 145.4 lb

## 2015-02-01 DIAGNOSIS — Z3483 Encounter for supervision of other normal pregnancy, third trimester: Secondary | ICD-10-CM

## 2015-02-01 LAB — POCT URINALYSIS DIPSTICK
Bilirubin, UA: NEGATIVE
Blood, UA: NEGATIVE
Glucose, UA: NORMAL
KETONES UA: NEGATIVE
NITRITE UA: NEGATIVE
PH UA: 8
Protein, UA: NEGATIVE
Spec Grav, UA: <=1.005
Urobilinogen, UA: NEGATIVE

## 2015-02-02 ENCOUNTER — Encounter: Payer: Self-pay | Admitting: Obstetrics

## 2015-02-02 ENCOUNTER — Inpatient Hospital Stay (HOSPITAL_COMMUNITY)
Admission: AD | Admit: 2015-02-02 | Discharge: 2015-02-04 | DRG: 775 | Disposition: A | Discharge status: Home / Self Care | Payer: Medicaid Other | Source: Ambulatory Visit | Attending: Obstetrics | Admitting: Obstetrics

## 2015-02-02 ENCOUNTER — Encounter (HOSPITAL_COMMUNITY): Payer: Self-pay

## 2015-02-02 DIAGNOSIS — Z3A39 39 weeks gestation of pregnancy: Secondary | ICD-10-CM | POA: Diagnosis present

## 2015-02-02 LAB — CBC
HCT: 36.1 % (ref 36.0–46.0)
HEMOGLOBIN: 12.1 g/dL (ref 12.0–15.0)
MCH: 30.4 pg (ref 26.0–34.0)
MCHC: 33.5 g/dL (ref 30.0–36.0)
MCV: 90.7 fL (ref 78.0–100.0)
Platelets: 140 10*3/uL — ABNORMAL LOW (ref 150–400)
RBC: 3.98 MIL/uL (ref 3.87–5.11)
RDW: 13.1 % (ref 11.5–15.5)
WBC: 10.7 10*3/uL — ABNORMAL HIGH (ref 4.0–10.5)

## 2015-02-02 LAB — TYPE AND SCREEN
ABO/RH(D): O POS
Antibody Screen: NEGATIVE

## 2015-02-02 MED ORDER — LACTATED RINGERS IV SOLN
500.0000 mL | INTRAVENOUS | Status: DC | PRN
Start: 2015-02-02 — End: 2015-02-03

## 2015-02-02 MED ORDER — LIDOCAINE HCL (PF) 1 % IJ SOLN
30.0000 mL | INTRAMUSCULAR | Status: DC | PRN
  Administered 2015-02-02: 30 mL via SUBCUTANEOUS
  Filled 2015-02-02: qty 30

## 2015-02-02 MED ORDER — ACETAMINOPHEN 325 MG PO TABS: 650.0000 mg | ORAL_TABLET | ORAL | Status: DC | PRN

## 2015-02-02 MED ORDER — OXYCODONE-ACETAMINOPHEN 5-325 MG PO TABS: 2.0000 | ORAL_TABLET | ORAL | Status: DC | PRN

## 2015-02-02 MED ORDER — LACTATED RINGERS IV SOLN
INTRAVENOUS | Status: DC
  Administered 2015-02-02: 21:00:00 via INTRAVENOUS

## 2015-02-02 MED ORDER — OXYTOCIN 40 UNITS IN LACTATED RINGERS INFUSION - SIMPLE MED
62.5000 mL/h | INTRAVENOUS | Status: DC
  Filled 2015-02-02: qty 1000

## 2015-02-02 MED ORDER — ONDANSETRON HCL 4 MG/2ML IJ SOLN
4.0000 mg | Freq: Four times a day (QID) | INTRAMUSCULAR | Status: DC | PRN
Start: 2015-02-02 — End: 2015-02-03

## 2015-02-02 MED ORDER — FENTANYL CITRATE (PF) 100 MCG/2ML IJ SOLN
100.0000 ug | INTRAMUSCULAR | Status: AC
  Administered 2015-02-02: 100 ug via INTRAVENOUS
  Filled 2015-02-02: qty 2

## 2015-02-02 MED ORDER — CITRIC ACID-SODIUM CITRATE 334-500 MG/5ML PO SOLN: 30.0000 mL | ORAL | Status: DC | PRN

## 2015-02-02 MED ORDER — OXYTOCIN BOLUS FROM INFUSION
500.0000 mL | INTRAVENOUS | Status: DC
  Administered 2015-02-02: 500 mL via INTRAVENOUS

## 2015-02-02 MED ORDER — OXYCODONE-ACETAMINOPHEN 5-325 MG PO TABS: 1.0000 | ORAL_TABLET | ORAL | Status: DC | PRN

## 2015-02-02 NOTE — Progress Notes (Signed)
Subjective:    [name] is a 34 y.o. female being seen today for her obstetrical visit. She is at [redacted]w[redacted]d gestation. Patient reports no complaints. Fetal movement: normal.  Problem List Items Addressed This Visit    None    Visit Diagnoses    Encounter for supervision of other normal pregnancy in third trimester    -  Primary    Relevant Orders    POCT urinalysis dipstick (Completed)      Patient Active Problem List   Diagnosis Date Noted  . [redacted] weeks gestation of pregnancy   . Supervision of high-risk pregnancy   . [redacted] weeks gestation of pregnancy   . Prior poor obstetrical history in second trimester, antepartum   . [redacted] weeks gestation of pregnancy   . Encounter for fetal anatomic survey   . Type 1 diabetes mellitus in third trimester, antepartum   . Recurrent pregnancy loss, antepartum condition or complication   . Pregnancy with poor reproductive history   . [redacted] weeks gestation of pregnancy   . [redacted] weeks gestation of pregnancy   . Preterm delivery, delivered 02/23/2014  . Preterm premature rupture of membranes (PPROM) with unknown onset of labor 02/22/2014  . 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 Infant feeding: plans to breastfeed. Cigarette smoking: never smoked. L&D discussion: symptoms of labor, discussed when to call, discussed what number to call, anesthetic/analgesic options reviewed and delivering clinician:  plans no preference. Postpartum supports and  preparation: circumcision discussed and contraception plans discussed.  Follow up in 1 Week.

## 2015-02-03 ENCOUNTER — Inpatient Hospital Stay (HOSPITAL_COMMUNITY): Payer: Medicaid Other | Admitting: Anesthesiology

## 2015-02-03 ENCOUNTER — Encounter (HOSPITAL_COMMUNITY): Payer: Self-pay | Admitting: *Deleted

## 2015-02-03 ENCOUNTER — Encounter (HOSPITAL_COMMUNITY): Payer: Self-pay | Admitting: Anesthesiology

## 2015-02-03 LAB — CBC
HCT: 32.2 % — ABNORMAL LOW (ref 36.0–46.0)
HEMOGLOBIN: 10.6 g/dL — AB (ref 12.0–15.0)
MCH: 29.8 pg (ref 26.0–34.0)
MCHC: 32.9 g/dL (ref 30.0–36.0)
MCV: 90.4 fL (ref 78.0–100.0)
Platelets: 154 10*3/uL (ref 150–400)
RBC: 3.56 MIL/uL — AB (ref 3.87–5.11)
RDW: 13.3 % (ref 11.5–15.5)
WBC: 13.5 10*3/uL — ABNORMAL HIGH (ref 4.0–10.5)

## 2015-02-03 LAB — RPR: RPR Ser Ql: NONREACTIVE

## 2015-02-03 LAB — HIV ANTIBODY (ROUTINE TESTING W REFLEX): HIV Screen 4th Generation wRfx: NONREACTIVE

## 2015-02-03 MED ORDER — TETANUS-DIPHTH-ACELL PERTUSSIS 5-2.5-18.5 LF-MCG/0.5 IM SUSP
0.5000 mL | Freq: Once | INTRAMUSCULAR | Status: AC
  Administered 2015-02-04: 0.5 mL via INTRAMUSCULAR

## 2015-02-03 MED ORDER — WITCH HAZEL-GLYCERIN EX PADS: 1.0000 "application " | MEDICATED_PAD | CUTANEOUS | Status: DC | PRN

## 2015-02-03 MED ORDER — ZOLPIDEM TARTRATE 5 MG PO TABS: 5.0000 mg | ORAL_TABLET | Freq: Every evening | ORAL | Status: DC | PRN

## 2015-02-03 MED ORDER — NITROGLYCERIN FOR UTERINE RELAXATION OPTIME 200MCG/ML
200.0000 ug | Freq: Once | INTRAVENOUS | Status: DC
  Filled 2015-02-03: qty 250

## 2015-02-03 MED ORDER — ONDANSETRON HCL 4 MG PO TABS: 4.0000 mg | ORAL_TABLET | ORAL | Status: DC | PRN

## 2015-02-03 MED ORDER — PRENATAL MULTIVITAMIN CH
1.0000 | ORAL_TABLET | Freq: Every day | ORAL | Status: DC
  Administered 2015-02-03 – 2015-02-04 (×2): 1 via ORAL
  Filled 2015-02-03 (×2): qty 1

## 2015-02-03 MED ORDER — PHENYLEPHRINE 40 MCG/ML (10ML) SYRINGE FOR IV PUSH (FOR BLOOD PRESSURE SUPPORT)
PREFILLED_SYRINGE | INTRAVENOUS | Status: AC
  Filled 2015-02-03: qty 50

## 2015-02-03 MED ORDER — DIBUCAINE 1 % RE OINT: 1.0000 "application " | TOPICAL_OINTMENT | RECTAL | Status: DC | PRN

## 2015-02-03 MED ORDER — OXYTOCIN 40 UNITS IN LACTATED RINGERS INFUSION - SIMPLE MED: 62.5000 mL/h | INTRAVENOUS | Status: DC | PRN

## 2015-02-03 MED ORDER — FERROUS SULFATE 325 (65 FE) MG PO TABS
325.0000 mg | ORAL_TABLET | Freq: Two times a day (BID) | ORAL | Status: DC
  Administered 2015-02-04: 325 mg via ORAL
  Filled 2015-02-03: qty 1

## 2015-02-03 MED ORDER — OXYCODONE-ACETAMINOPHEN 5-325 MG PO TABS: 1.0000 | ORAL_TABLET | ORAL | Status: DC | PRN

## 2015-02-03 MED ORDER — PHENYLEPHRINE 40 MCG/ML (10ML) SYRINGE FOR IV PUSH (FOR BLOOD PRESSURE SUPPORT)
1400.0000 ug | PREFILLED_SYRINGE | Freq: Once | INTRAVENOUS | Status: AC
  Administered 2015-02-03: 120 ug via INTRAVENOUS

## 2015-02-03 MED ORDER — SIMETHICONE 80 MG PO CHEW: 80.0000 mg | CHEWABLE_TABLET | ORAL | Status: DC | PRN

## 2015-02-03 MED ORDER — LANOLIN HYDROUS EX OINT: TOPICAL_OINTMENT | CUTANEOUS | Status: DC | PRN

## 2015-02-03 MED ORDER — NITROGLYCERIN FOR UTERINE RELAXATION OPTIME 200MCG/ML
INTRAVENOUS | Status: AC | PRN
  Administered 2015-02-03: 600 ug via INTRAVENOUS

## 2015-02-03 MED ORDER — IBUPROFEN 600 MG PO TABS
600.0000 mg | ORAL_TABLET | Freq: Four times a day (QID) | ORAL | Status: DC
  Administered 2015-02-03 – 2015-02-04 (×6): 600 mg via ORAL
  Filled 2015-02-03 (×6): qty 1

## 2015-02-03 MED ORDER — ONDANSETRON HCL 4 MG/2ML IJ SOLN: 4.0000 mg | INTRAMUSCULAR | Status: DC | PRN

## 2015-02-03 MED ORDER — SENNOSIDES-DOCUSATE SODIUM 8.6-50 MG PO TABS
2.0000 | ORAL_TABLET | ORAL | Status: DC
  Administered 2015-02-03: 2 via ORAL
  Filled 2015-02-03 (×2): qty 2

## 2015-02-03 MED ORDER — OXYCODONE-ACETAMINOPHEN 5-325 MG PO TABS: 2.0000 | ORAL_TABLET | ORAL | Status: DC | PRN

## 2015-02-03 MED ORDER — ACETAMINOPHEN 325 MG PO TABS: 650.0000 mg | ORAL_TABLET | ORAL | Status: DC | PRN

## 2015-02-03 MED ORDER — BENZOCAINE-MENTHOL 20-0.5 % EX AERO
1.0000 "application " | INHALATION_SPRAY | CUTANEOUS | Status: DC | PRN
  Administered 2015-02-04: 1 via TOPICAL
  Filled 2015-02-03 (×2): qty 56

## 2015-02-03 MED ORDER — DIPHENHYDRAMINE HCL 25 MG PO CAPS: 25.0000 mg | ORAL_CAPSULE | Freq: Four times a day (QID) | ORAL | Status: DC | PRN

## 2015-02-03 MED ORDER — VITAFOL-NANO 18-0.6-0.4 MG PO TABS
1.0000 | ORAL_TABLET | Freq: Every day | ORAL | Status: DC
Start: 2015-02-03 — End: 2015-02-03

## 2015-02-03 NOTE — H&P (Signed)
[name] is a 33 y.o. female presenting for UC's. Maternal Medical History:  Reason for admission: Contractions.   Fetal activity: Perceived fetal activity is normal.   Last perceived fetal movement was within the past hour.    Prenatal complications: no prenatal complications Prenatal Complications - Diabetes: none.    OB History    Gravida Para Term Preterm AB TAB SAB Ectopic Multiple Living   3 3 2 1      0 2     Past Medical History  Diagnosis Date  . Medical history non-contributory    Past Surgical History  Procedure Laterality Date  . Vaginal delivery  2008    epsiotomy with delivery  . No past surgeries     Family History: family history is not on file. Social History:  reports that she has never smoked. She has never used smokeless tobacco. She reports that she does not drink alcohol or use illicit drugs.   Prenatal Transfer Tool  Maternal Diabetes: No Genetic Screening: Normal Maternal Ultrasounds/Referrals: Normal Fetal Ultrasounds or other Referrals:  None Maternal Substance Abuse:  No Significant Maternal Medications:  None Significant Maternal Lab Results:  None Other Comments:  None  Review of Systems  All other systems reviewed and are negative.   Dilation: 10 Effacement (%): 90 Station: 0 Exam by:: Denney, CNM Blood pressure 114/62, pulse 64, temperature 98.7 F (37.1 C), temperature source Oral, resp. rate 18, height 5' 2.5" (1.588 m), weight 145 lb (65.772 kg), last menstrual period 05/04/2014, SpO2 100 %, unknown if currently breastfeeding. Maternal Exam:  Abdomen: Patient reports no abdominal tenderness. Fetal presentation: vertex  Introitus: Normal vulva. Normal vagina.  Pelvis: adequate for delivery.   Cervix: Cervix evaluated by digital exam.     Physical Exam  Nursing note and vitals reviewed. Constitutional: She is oriented to person, place, and time. She appears well-developed and well-nourished.  HENT:  Head:  Normocephalic and atraumatic.  Eyes: Conjunctivae are normal. Pupils are equal, round, and reactive to light.  Neck: Normal range of motion. Neck supple.  Cardiovascular: Normal rate and regular rhythm.   Respiratory: Effort normal and breath sounds normal.  GI: Soft. Bowel sounds are normal.  Genitourinary: Vagina normal and uterus normal.  Musculoskeletal: Normal range of motion.  Neurological: She is alert and oriented to person, place, and time.  Skin: Skin is warm and dry.  Psychiatric: She has a normal mood and affect. Her behavior is normal. Judgment and thought content normal.    Prenatal labs: ABO, Rh: --/--/O POS (08/25 2123) Antibody: NEG (08/25 2123) Rubella: 22.80 (02/04 1137) RPR: NON REAC (06/03 1405)  HBsAg: NEGATIVE (02/04 1137)  HIV: NONREACTIVE (06/03 1405)  GBS: NOT DETECTED (07/27 1701)   Assessment/Plan: 39 weeks.  Active labor.  Admit.   HARPER,CHARLES A 02/03/2015, 7:20 AM

## 2015-02-03 NOTE — Progress Notes (Signed)
This was a celebratory visit with [name] whom I first met when she experienced the loss of a baby over a year ago.  She related her birth experience and processed the emotions of giving birth to a healthy baby girl after a loss.  I offered emotional and spiritual support to her and her daughter, [name].  Chaplain Katy Claussen, Bcc Pager, [phone] 3:13 PM   

## 2015-02-03 NOTE — Lactation Note (Signed)
This note was copied from the chart of [name]. Lactation Consultation Note Initial visit at 28 hours of age.  Mom reports using a NS and baby hasn't been doing well with it.  Mom has spoon fed expressed colostrum.  Mom is using a #16 NS that appears too small.  Encouraged mom to work with hand pump first to help evert nipples.  #24 NS fits best and mom agrees.  Mom is able to return demonstration of NS application. Baby is sleepy after previous feedings and will not latch.  Demonstrated with baby positioning for latch with mom.  Mom needs encouragement, but is doing well.  ALso encouraged mom to attempt latch without NS.  [facility] LC resources given and discussed.  Encouraged to feed with early cues on demand.  Early newborn behavior discussed.  Hand expression demonstrated with large amounts of colostrum visible.  Mom to call for assist as needed.     Patient Name: [name] [id number] Date: 02/03/2015 Reason for consult: Initial assessment   Maternal Data Has patient been taught Hand Expression?: Yes  Feeding Feeding Type: Breast Fed Length of feed: 10 min (off and on)  LATCH Score/Interventions Latch: Too sleepy or reluctant, no latch achieved, no sucking elicited.  Audible Swallowing: None  Type of Nipple: Flat Intervention(s): Hand pump  Comfort (Breast/Nipple): Soft / non-tender     Hold (Positioning): Assistance needed to correctly position infant at breast and maintain latch. Intervention(s): Breastfeeding basics reviewed;Support Pillows;Position options;Skin to skin  LATCH Score: 4  Lactation Tools Discussed/Used Date initiated:: 02/03/15   Consult Status Consult Status: Follow-up Date: 02/04/15 Follow-up type: In-patient    Justice Britain 02/03/2015, 9:02 PM

## 2015-02-03 NOTE — Progress Notes (Signed)
Post Partum Day 1 Subjective: no complaints  Objective: Blood pressure 114/62, pulse 64, temperature 98.7 F (37.1 C), temperature source Oral, resp. rate 18, height 5' 2.5" (1.588 m), weight 145 lb (65.772 kg), last menstrual period 05/04/2014, SpO2 100 %, unknown if currently breastfeeding.  Physical Exam:  General: alert and no distress Lochia: appropriate Uterine Fundus: firm Incision: healing well DVT Evaluation: No evidence of DVT seen on physical exam.   Recent Labs  02/02/15 2123  HGB 12.1  HCT 36.1    Assessment/Plan: Plan for discharge tomorrow   LOS: 1 day   Makyna Niehoff A 02/03/2015, 7:09 AM

## 2015-02-04 MED ORDER — IBUPROFEN 600 MG PO TABS: 600.0000 mg | ORAL_TABLET | Freq: Four times a day (QID) | ORAL | Status: DC | PRN

## 2015-02-04 MED ORDER — OXYCODONE-ACETAMINOPHEN 5-325 MG PO TABS: 2.0000 | ORAL_TABLET | ORAL | Status: DC | PRN

## 2015-02-04 NOTE — Progress Notes (Signed)
Pt stated she was very upset that the fingerprint card was smeared and is requesting another set done. Pt has had at least 25 attempts made with the footprints and all but one where done to her satisfaction. Called [facility] and an NT completed footprints to pt satisfaction. D/c instructions and prescriptions reviewed with pt. Pt will call for her f/u appt. Infant's f/u appt scheduled for 02-06-15. D/c home mom and infant stable condition.

## 2015-02-04 NOTE — Progress Notes (Signed)
Post Partum Day 2 Subjective: no complaints  Objective: Blood pressure 106/57, pulse 72, temperature 98.7 F (37.1 C), temperature source Oral, resp. rate 14, height 5' 2.5" (1.588 m), weight 145 lb (65.772 kg), last menstrual period 05/04/2014, SpO2 99 %, unknown if currently breastfeeding.  Physical Exam:  General: alert and no distress Lochia: appropriate Uterine Fundus: firm Incision: none DVT Evaluation: No evidence of DVT seen on physical exam.   Recent Labs  02/02/15 2123 02/03/15 0525  HGB 12.1 10.6*  HCT 36.1 32.2*    Assessment/Plan: Discharge home   LOS: 2 days   HARPER,CHARLES A 02/04/2015, 7:42 AM

## 2015-02-04 NOTE — Discharge Summary (Signed)
Obstetric Discharge Summary Reason for Admission: onset of labor Prenatal Procedures: ultrasound Intrapartum Procedures: spontaneous vaginal delivery Postpartum Procedures: none Complications-Operative and Postpartum: none HEMOGLOBIN  Date Value Ref Range Status  02/03/2015 10.6* 12.0 - 15.0 g/dL Final   HCT  Date Value Ref Range Status  02/03/2015 32.2* 36.0 - 46.0 % Final    Physical Exam:  General: alert and no distress Lochia: appropriate Uterine Fundus: firm Incision: none DVT Evaluation: No evidence of DVT seen on physical exam.  Discharge Diagnoses: Term Pregnancy-delivered  Discharge Information: Date: 02/04/2015 Activity: pelvic rest Diet: routine Medications: PNV, Ibuprofen, Colace and Percocet Condition: stable Instructions: refer to practice specific booklet Discharge to: home Follow-up Information    Follow up with Jamyrah Saur A, MD In 2 weeks.   Specialty:  Obstetrics and Gynecology   Contact information:   [address] [place] [id number] [phone]       Newborn Data: Live born female  Birth Weight: 6 lb 11.6 oz (3050 g) APGAR: 8, 9  Home with mother.  Charde Macfarlane A 02/04/2015, 7:48 AM

## 2015-02-07 ENCOUNTER — Ambulatory Visit (HOSPITAL_COMMUNITY): Payer: Medicaid Other

## 2015-02-08 ENCOUNTER — Encounter: Payer: Medicaid Other | Admitting: Obstetrics

## 2015-02-16 ENCOUNTER — Encounter: Payer: Self-pay | Admitting: Obstetrics

## 2015-02-16 ENCOUNTER — Ambulatory Visit: Payer: Medicaid Other | Admitting: Obstetrics

## 2015-02-16 ENCOUNTER — Ambulatory Visit (INDEPENDENT_AMBULATORY_CARE_PROVIDER_SITE_OTHER): Payer: Medicaid Other | Admitting: Obstetrics

## 2015-02-16 DIAGNOSIS — K5909 Other constipation: Secondary | ICD-10-CM

## 2015-02-16 DIAGNOSIS — K644 Residual hemorrhoidal skin tags: Secondary | ICD-10-CM

## 2015-02-16 DIAGNOSIS — R05 Cough: Secondary | ICD-10-CM

## 2015-02-16 DIAGNOSIS — K5904 Chronic idiopathic constipation: Secondary | ICD-10-CM

## 2015-02-16 DIAGNOSIS — J029 Acute pharyngitis, unspecified: Secondary | ICD-10-CM

## 2015-02-16 DIAGNOSIS — K648 Other hemorrhoids: Secondary | ICD-10-CM

## 2015-02-16 DIAGNOSIS — L700 Acne vulgaris: Secondary | ICD-10-CM

## 2015-02-16 DIAGNOSIS — R059 Cough, unspecified: Secondary | ICD-10-CM

## 2015-02-16 MED ORDER — AZITHROMYCIN 250 MG PO TABS: ORAL_TABLET | ORAL | Status: DC

## 2015-02-16 MED ORDER — DOCUSATE SODIUM 100 MG PO CAPS: 100.0000 mg | ORAL_CAPSULE | Freq: Two times a day (BID) | ORAL | Status: DC

## 2015-02-16 MED ORDER — CLINDAMYCIN PHOSPHATE 1 % EX LOTN: TOPICAL_LOTION | Freq: Two times a day (BID) | CUTANEOUS | Status: DC

## 2015-02-16 MED ORDER — GUAIFENESIN-CODEINE 100-10 MG/5ML PO SYRP: 5.0000 mL | ORAL_SOLUTION | Freq: Three times a day (TID) | ORAL | Status: DC | PRN

## 2015-02-16 NOTE — Progress Notes (Signed)
Subjective:     [name] is a 34 y.o. female who presents for a postpartum visit. She is 2 weeks postpartum following a spontaneous vaginal delivery. I have fully reviewed the prenatal and intrapartum course. The delivery was at 65 gestational weeks. Outcome: spontaneous vaginal delivery. Anesthesia: none. Postpartum course has been normal. Baby's course has been normal. Baby is feeding by breast. Bleeding thin lochia. Bowel function is normal but has a tender external hemorrhoid. Bladder function is normal. Patient is not sexually active. Contraception method is abstinence. Postpartum depression screening: negative.  Tobacco, alcohol and substance abuse history reviewed.  Adult immunizations reviewed including TDAP, rubella and varicella.  The following portions of the patient's history were reviewed and updated as appropriate: allergies, current medications, past family history, past medical history, past social history, past surgical history and problem list.  Review of Systems A comprehensive review of systems was negative.   Objective:    BP 120/80 mmHg  Pulse 82  Wt 125 lb (56.7 kg)  LMP 05/04/2014      Assessment:   2 weeks postpartum.  Doing well. Pharyngitis Constipation  Hemorrhoids   Plan:    1. Contraception: abstinence 2. Continue PNV's 3. Follow up in: 4 weeks or as needed.   Healthy lifestyle practices reviewed

## 2015-02-22 ENCOUNTER — Telehealth: Payer: Self-pay | Admitting: Obstetrics

## 2015-02-24 NOTE — Telephone Encounter (Signed)
02/24/2015 - Patient returned call. brm

## 2015-02-25 IMAGING — US US OB COMP LESS 14 WK
1 series · 14 of 28 positions shown · non-contrast
Comparison: None.

CLINICAL DATA: Pregnant, intermittent vaginal bleeding

EXAM:
OBSTETRIC <14 WK ULTRASOUND
TECHNIQUE: Transabdominal ultrasound was performed for evaluation of the
gestation as well as the maternal uterus and adnexal regions.

[Series 1: us ob comp less 14 wks · 14 of 30 slices shown]
[im 2/30]
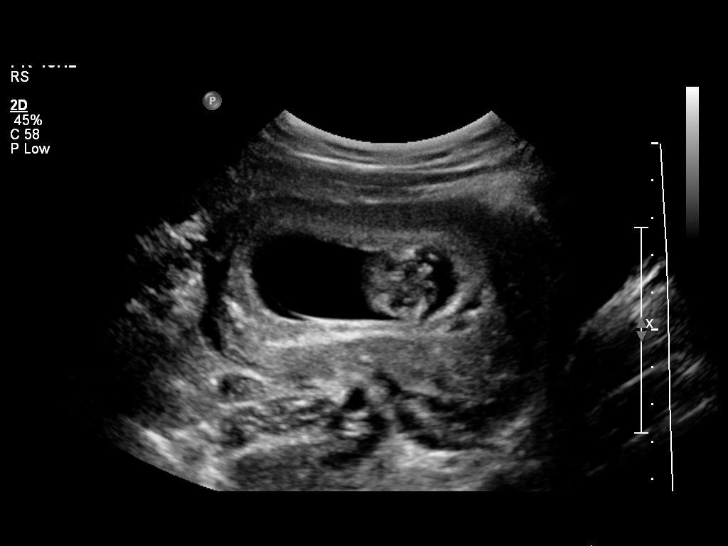
[im 4/30]
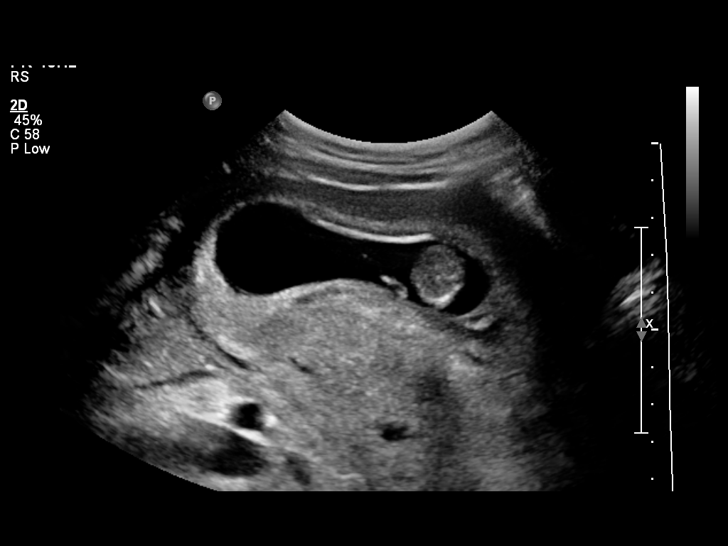
[im 6/30]
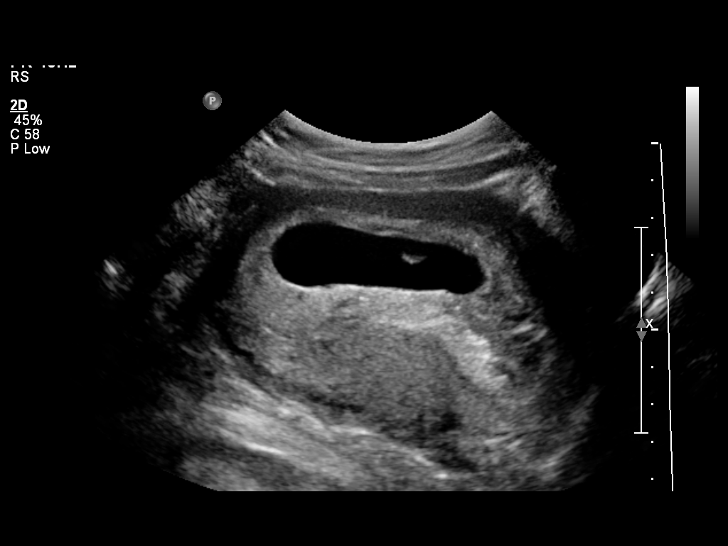
[im 8/30]
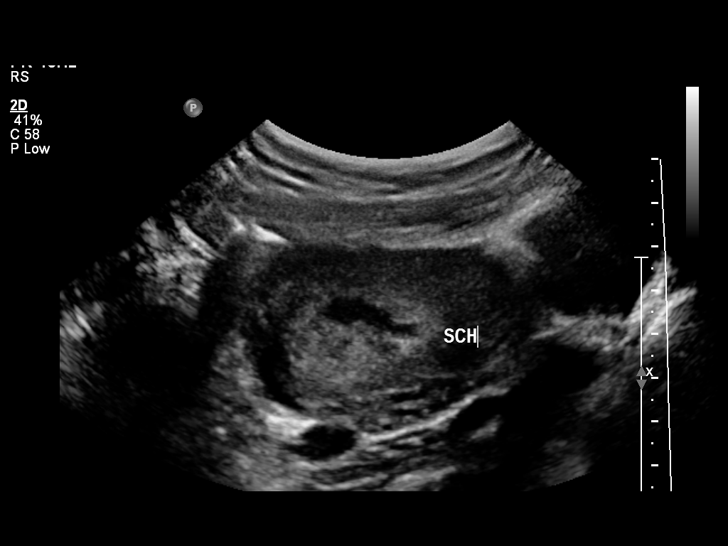
[im 10/30]
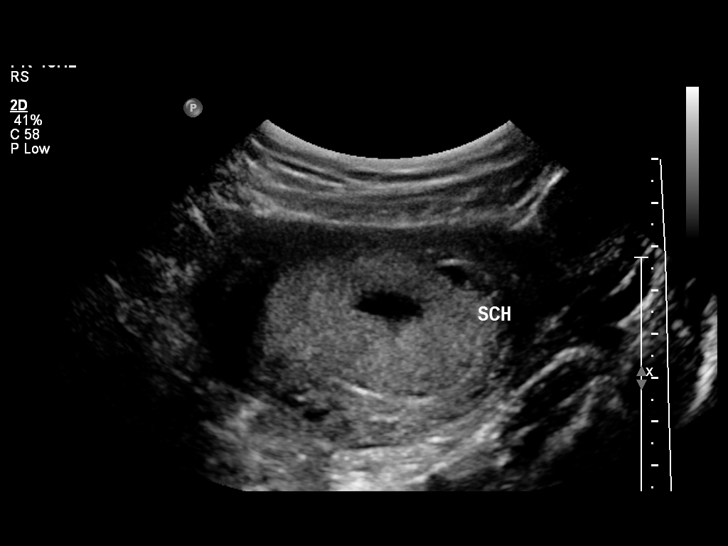
[im 12/30]
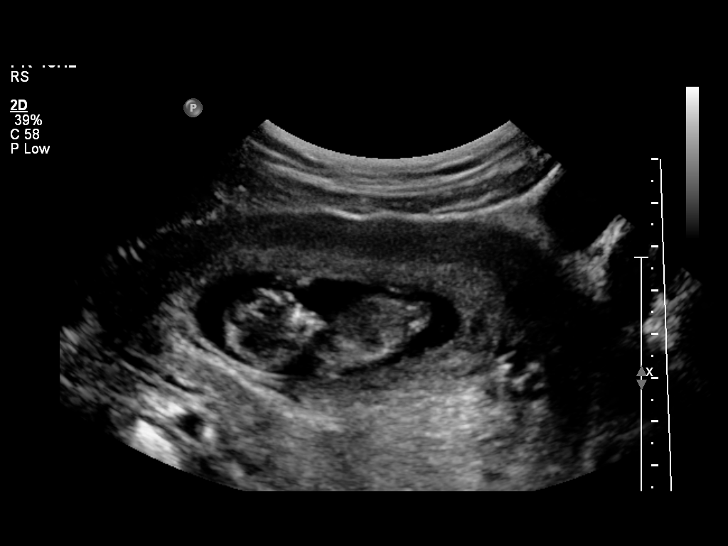
[im 14/30]
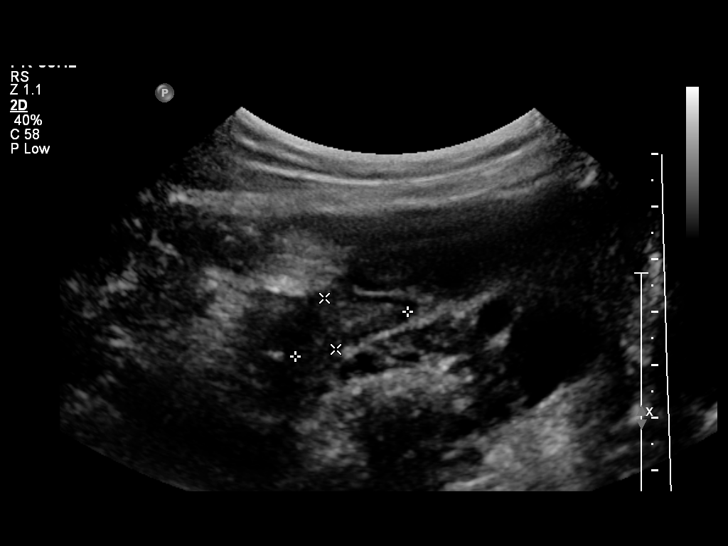
[im 17/30]
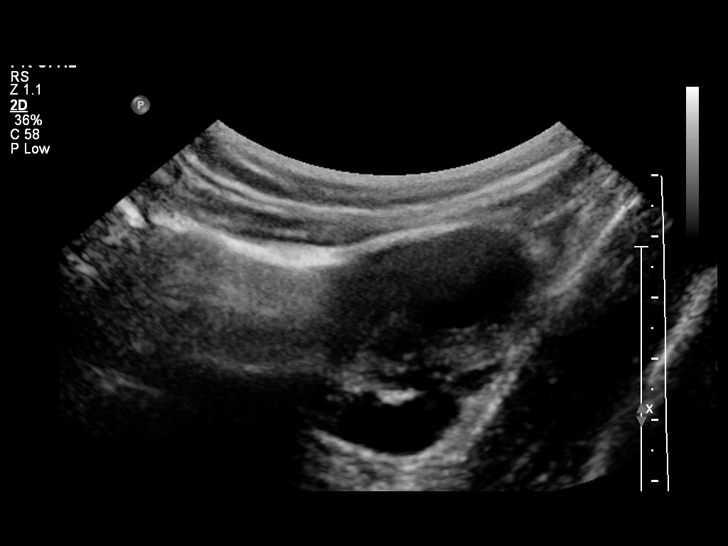
[im 19/30]
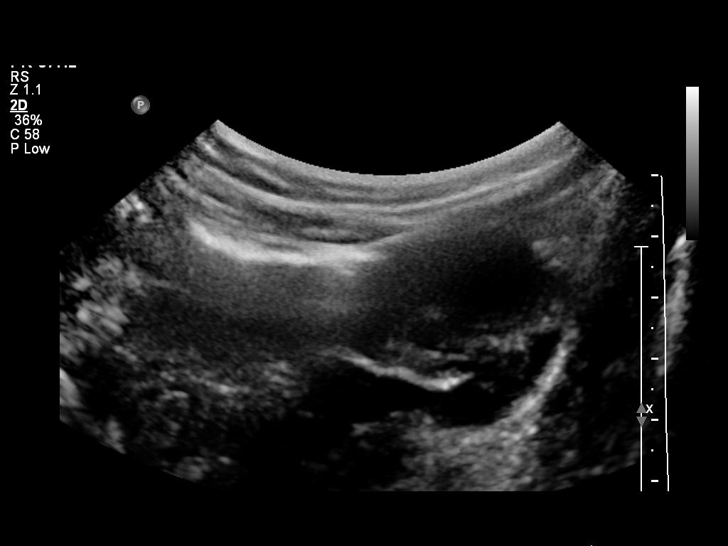
[im 21/30]
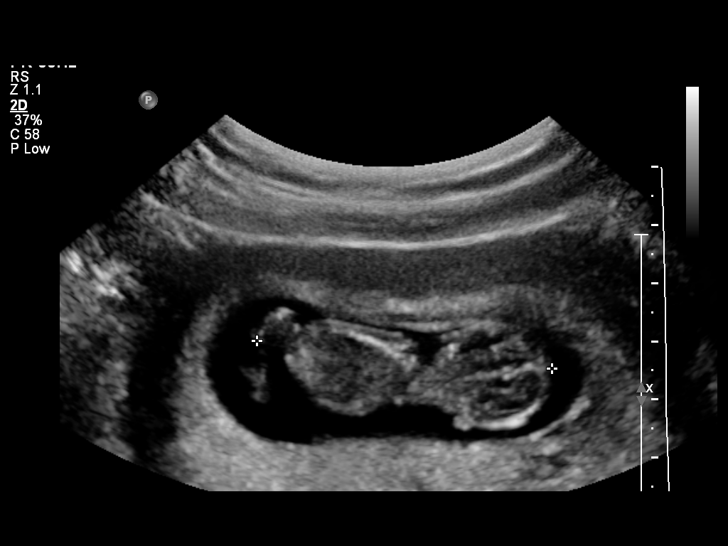
[im 23/30]
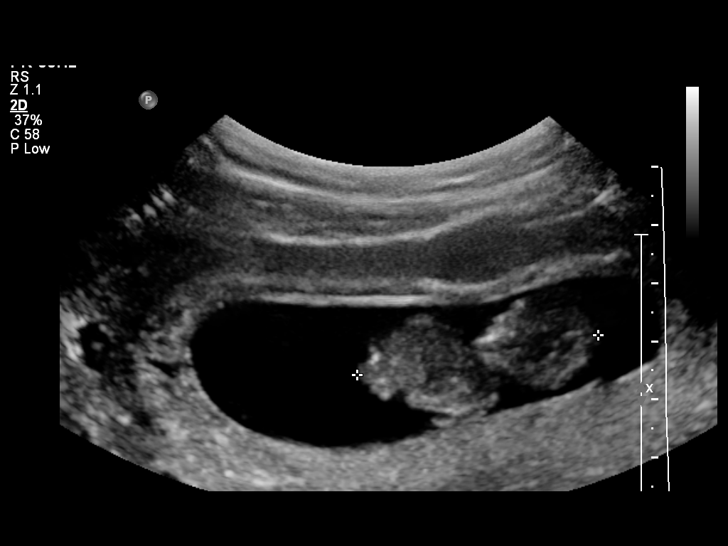
[im 25/30]
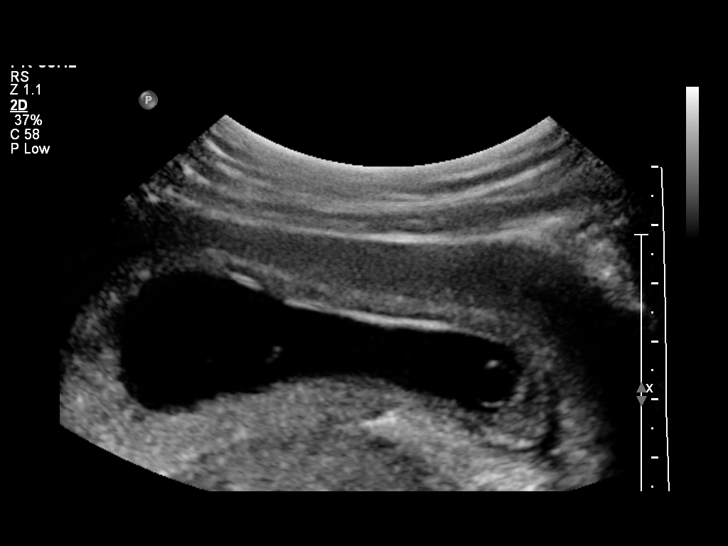
[im 27/30]
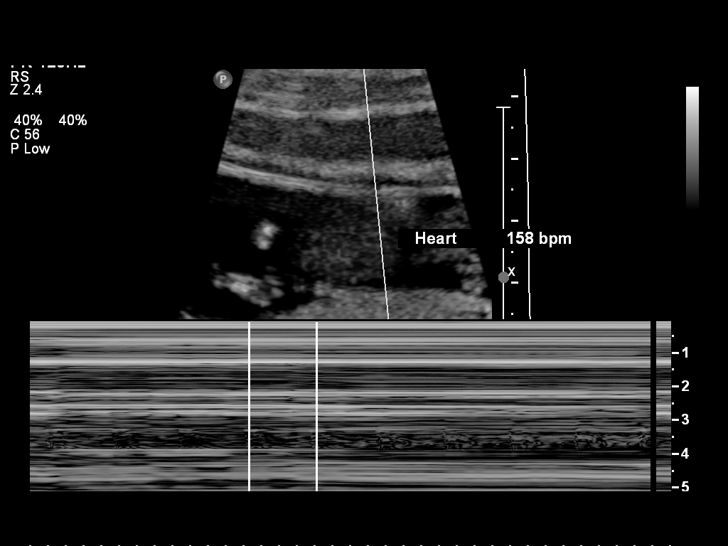
[im 30/30]
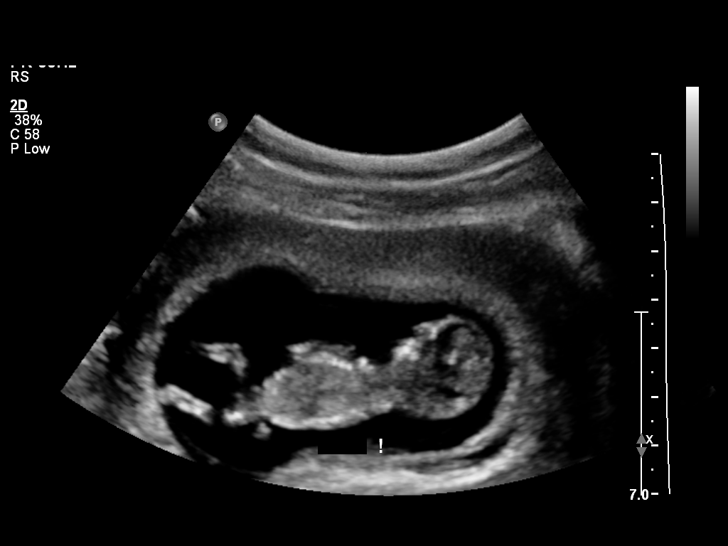

[14 of 28 positions shown; findings below may reference images not displayed]

FINDINGS: Intrauterine gestational sac: Visualized/normal in shape.

Yolk sac:  Present

Embryo:  Present

Cardiac Activity: Present

Heart Rate: 158 bpm

CRL:   49.5  mm   11 w 5 d                  US EDC: 02/05/2015

Maternal uterus/adnexae: Small subchronic hemorrhage.

Bilateral ovaries are within normal limits.

No free fluid.
IMPRESSION: Single live intrauterine gestation with estimated gestational age 11
weeks 5 days by crown-rump length.

## 2015-02-27 ENCOUNTER — Telehealth: Payer: Self-pay | Admitting: *Deleted

## 2015-02-27 NOTE — Telephone Encounter (Signed)
Pt called to office asking for return call.  Return call to pt.  Pt states that she is still having pain/dicomfort in her vaginal area.  It is noted that pt has 2nd degree laceration at delivery. Pt made aware of this and informed that it may take several weeks for vaginal area to feel that it has healed.  Pt states that she has been using sitz bath and comfort measures.  Pt states that she is also still having problems with constipation.  Pt was advised to take stool softeners as needed in order to prevent unneeded pressure in bottom.  Pt advised pressure and straining with BM may make vaginal area feel more tender.  Pt has another PP appt in 2 weeks.  Pt advised to continue as she has and try to get BM more regular.  Pt advised to contact office if vaginal discomfort worsens and she may move PP appt to an earlier date. Pt states understanding.

## 2015-03-16 ENCOUNTER — Ambulatory Visit (INDEPENDENT_AMBULATORY_CARE_PROVIDER_SITE_OTHER): Payer: Medicaid Other | Admitting: Obstetrics

## 2015-03-16 ENCOUNTER — Encounter: Payer: Self-pay | Admitting: Obstetrics

## 2015-03-16 DIAGNOSIS — Z3009 Encounter for other general counseling and advice on contraception: Secondary | ICD-10-CM

## 2015-03-16 NOTE — Progress Notes (Signed)
Subjective:     [name] is a 34 y.o. female who presents for a postpartum visit. She is 6 weeks postpartum following a spontaneous vaginal delivery. I have fully reviewed the prenatal and intrapartum course. The delivery was at 19 gestational weeks. Outcome: . Anesthesia: epidural. Postpartum course has been normal. Baby's course has been normal. Baby is feeding by breast. Bleeding no bleeding. Bowel function is normal. Bladder function is normal. Patient is not sexually active. Contraception method is abstinence. Postpartum depression screening: negative.  Tobacco, alcohol and substance abuse history reviewed.  Adult immunizations reviewed including TDAP, rubella and varicella.  The following portions of the patient's history were reviewed and updated as appropriate: allergies, current medications, past family history, past medical history, past social history, past surgical history and problem list.  Review of Systems A comprehensive review of systems was negative.   Objective:    BP 109/69 mmHg  Pulse 79  Wt 125 lb (56.7 kg)  Breastfeeding? Yes  General:  alert and no distress   Breasts:  inspection negative, no nipple discharge or bleeding, no masses or nodularity palpable  Lungs: clear to auscultation bilaterally  Heart:  regular rate and rhythm, S1, S2 normal, no murmur, click, rub or gallop  Abdomen: normal findings: soft, non-tender   Vulva:  normal  Vagina: normal vagina  Cervix:  no cervical motion tenderness  Corpus: normal size, contour, position, consistency, mobility, non-tender  Adnexa:  no mass, fullness, tenderness  Rectal Exam: Not performed.           Assessment:     Normal postpartum exam. Pap smear not done at today's visit.     Contraceptive counseling and advice  Plan:    1. Contraception: options discussed 2. Continue PNV's 3. Follow up in: 6 weeks or as needed.   Healthy lifestyle practices reviewed

## 2015-04-27 ENCOUNTER — Ambulatory Visit: Payer: Medicaid Other | Admitting: Obstetrics

## 2015-05-02 ENCOUNTER — Ambulatory Visit (INDEPENDENT_AMBULATORY_CARE_PROVIDER_SITE_OTHER): Payer: Medicaid Other | Admitting: Obstetrics

## 2015-05-02 VITALS — BP 117/82 | HR 67 | Wt 127.4 lb

## 2015-05-02 DIAGNOSIS — Z0000 Encounter for general adult medical examination without abnormal findings: Secondary | ICD-10-CM | POA: Diagnosis not present

## 2015-05-02 DIAGNOSIS — R87810 Cervical high risk human papillomavirus (HPV) DNA test positive: Secondary | ICD-10-CM

## 2015-05-02 DIAGNOSIS — Z01419 Encounter for gynecological examination (general) (routine) without abnormal findings: Secondary | ICD-10-CM | POA: Diagnosis not present

## 2015-05-03 ENCOUNTER — Encounter: Payer: Self-pay | Admitting: Obstetrics

## 2015-05-03 NOTE — Progress Notes (Signed)
Patient ID: [name], female   DOB: [birth year], 34 y.o.   MRN: [id number]  Chief Complaint  Patient presents with  . Follow-up    pap today    HPI [name] is a 34 y.o. female.  Presents for annual exam.  HPI  Past Medical History  Diagnosis Date  . Medical history non-contributory     Past Surgical History  Procedure Laterality Date  . Vaginal delivery  2008    epsiotomy with delivery  . No past surgeries      History reviewed. No pertinent family history.  Social History Social History  Substance Use Topics  . Smoking status: Never Smoker   . Smokeless tobacco: Never Used  . Alcohol Use: No    No Known Allergies  Current Outpatient Prescriptions  Medication Sig Dispense Refill  . azithromycin (ZITHROMAX Z-PAK) 250 MG tablet Take as directed. (Patient not taking: Reported on 03/16/2015) 6 tablet 2  . clindamycin (CLEOCIN T) 1 % lotion Apply topically 2 (two) times daily. (Patient not taking: Reported on 03/16/2015) 60 mL 2  . docusate sodium (COLACE) 100 MG capsule Take 1 capsule (100 mg total) by mouth 2 (two) times daily. (Patient not taking: Reported on 03/16/2015) 30 capsule 5  . guaiFENesin-codeine (ROBITUSSIN AC) 100-10 MG/5ML syrup Take 5 mLs by mouth 3 (three) times daily as needed for cough. (Patient not taking: Reported on 03/16/2015) 120 mL 0  . ibuprofen (ADVIL,MOTRIN) 600 MG tablet Take 1 tablet (600 mg total) by mouth every 6 (six) hours as needed for mild pain or moderate pain. (Patient not taking: Reported on 03/16/2015) 30 tablet 5  . oxyCODONE-acetaminophen (PERCOCET/ROXICET) 5-325 MG per tablet Take 2 tablets by mouth every 4 (four) hours as needed for severe pain (for pain scale greater than 7). (Patient not taking: Reported on 03/16/2015) 40 tablet 0  . Prenatal Vit-FePoly-FA-DHA (VITAFOL-ONE) 29-1-200 MG CAPS Take 1 capsule by mouth daily before breakfast. 30 capsule 11   No current facility-administered medications for this  visit.   Facility-Administered Medications Ordered in Other Visits  Medication Dose Route Frequency Provider Last Rate Last Dose  . nitroGLYCERIN for uterine relaxation 200 mcg/mL    Anesthesia Intra-op Brock Ra, CRNA   600 mcg at 02/03/15 0003    Review of Systems Review of Systems Constitutional: negative for fatigue and weight loss Respiratory: negative for cough and wheezing Cardiovascular: negative for chest pain, fatigue and palpitations Gastrointestinal: negative for abdominal pain and change in bowel habits Genitourinary:negative Integument/breast: negative for nipple discharge Musculoskeletal:negative for myalgias Neurological: negative for gait problems and tremors Behavioral/Psych: negative for abusive relationship, depression Endocrine: negative for temperature intolerance     Blood pressure 117/82, pulse 67, weight 127 lb 6.4 oz (57.788 kg), last menstrual period 04/24/2015, currently breastfeeding.  Physical Exam Physical Exam General:   alert  Skin:   no rash or abnormalities  Lungs:   clear to auscultation bilaterally  Heart:   regular rate and rhythm, S1, S2 normal, no murmur, click, rub or gallop  Breasts:   normal without suspicious masses, skin or nipple changes or axillary nodes  Abdomen:  normal findings: no organomegaly, soft, non-tender and no hernia  Pelvis:  External genitalia: normal general appearance Urinary system: urethral meatus normal and bladder without fullness, nontender Vaginal: normal without tenderness, induration or masses Cervix: normal appearance Adnexa: normal bimanual exam Uterus: anteverted and non-tender, normal size      Data Reviewed Labs Previous pap smear  Assessment  Doing well.  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    Plan    F/U in 1 year for pap smear.  Orders Placed This Encounter  Procedures  . SureSwab, Vaginosis/Vaginitis Plus   No orders of the defined types were placed in  this encounter.

## 2015-05-06 LAB — SURESWAB, VAGINOSIS/VAGINITIS PLUS
Atopobium vaginae: NOT DETECTED Log (cells/mL)
C. ALBICANS, DNA: NOT DETECTED
C. GLABRATA, DNA: NOT DETECTED
C. PARAPSILOSIS, DNA: NOT DETECTED
C. TROPICALIS, DNA: NOT DETECTED
C. trachomatis RNA, TMA: NOT DETECTED
Gardnerella vaginalis: 5.6 Log (cells/mL)
LACTOBACILLUS SPECIES: NOT DETECTED Log (cells/mL)
MEGASPHAERA SPECIES: NOT DETECTED Log (cells/mL)
N. GONORRHOEAE RNA, TMA: NOT DETECTED
T. vaginalis RNA, QL TMA: NOT DETECTED

## 2015-05-26 IMAGING — US US MFM OB TRANSVAGINAL
1 series · 13 of 14 positions shown · non-contrast
Comparison: none

[Series 1: us mfm ob transvaginal · 0.23mm/px · 14 acquisitions, 13 frames shown]
[im 1/14]
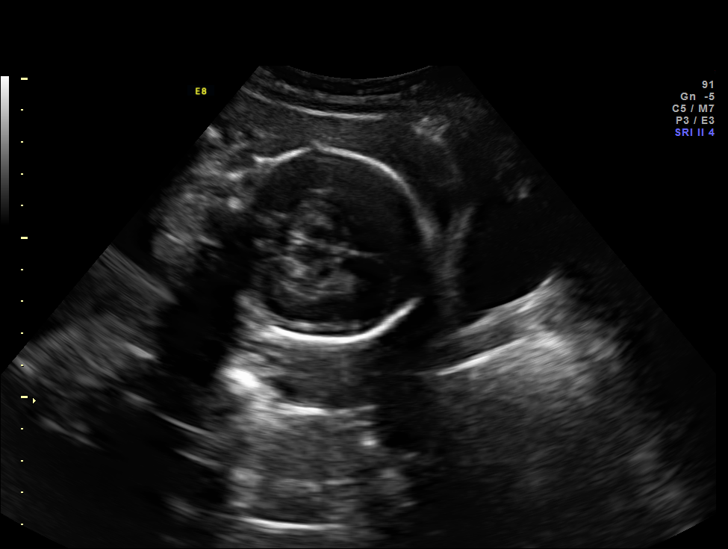
[im 2/14]
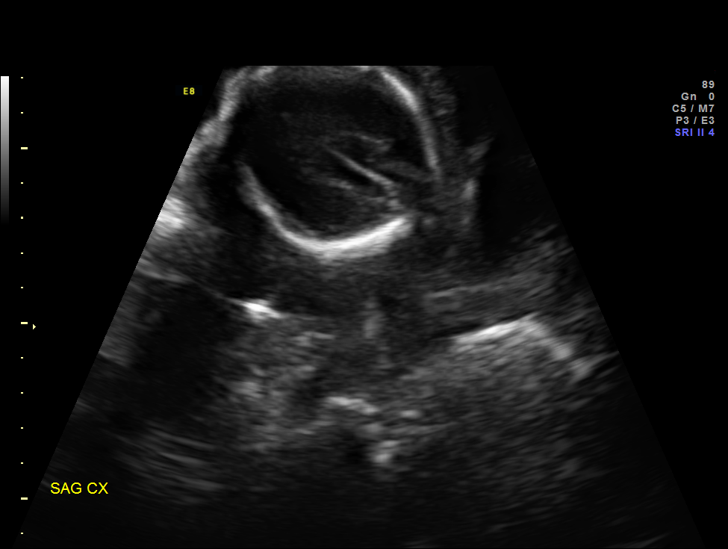
[im 3/14]
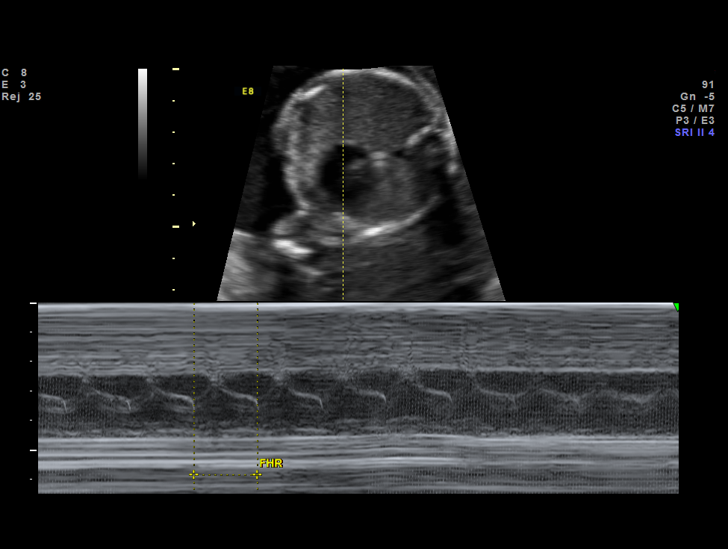
[im 4/14]
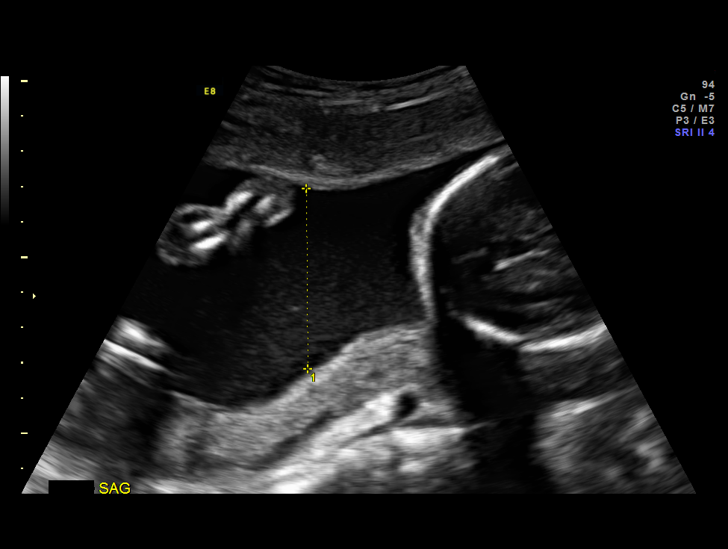
[im 5/14]
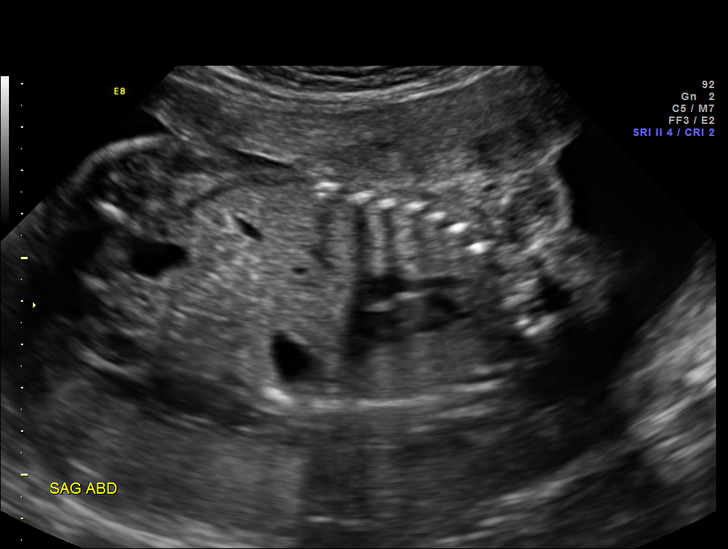
[im 6/14]
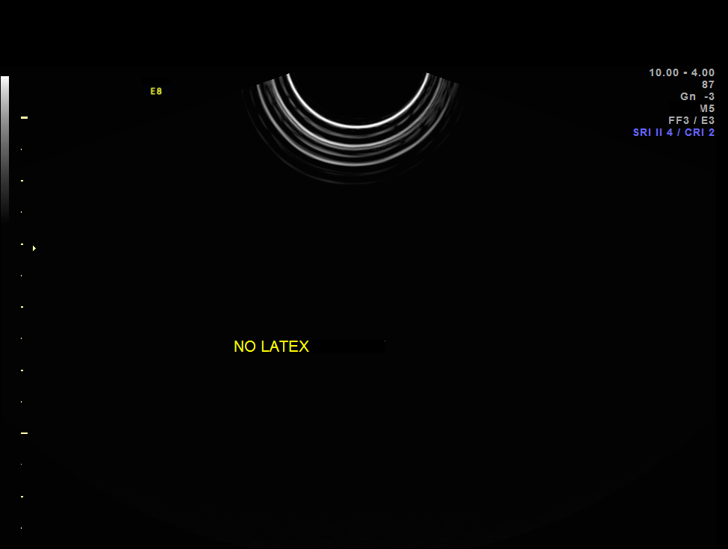
[im 8/14]
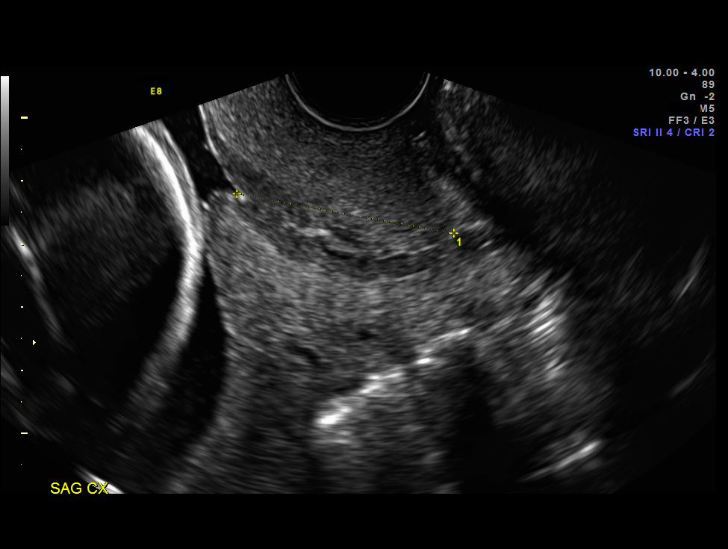
[im 9/14]
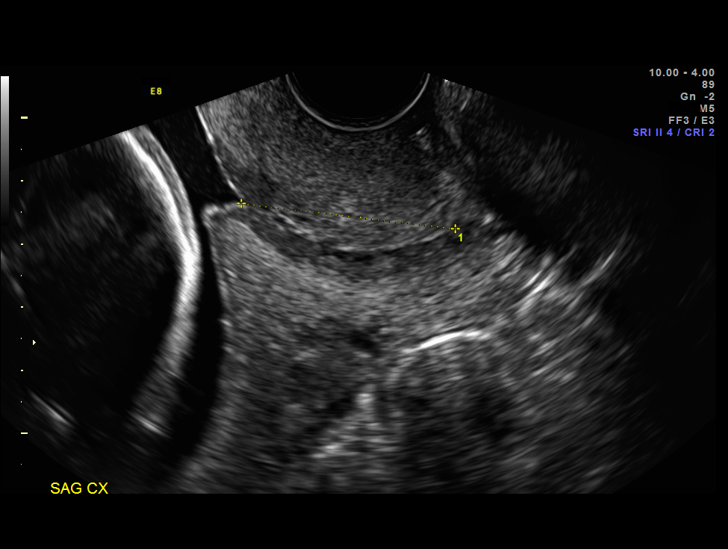
[im 10/14]
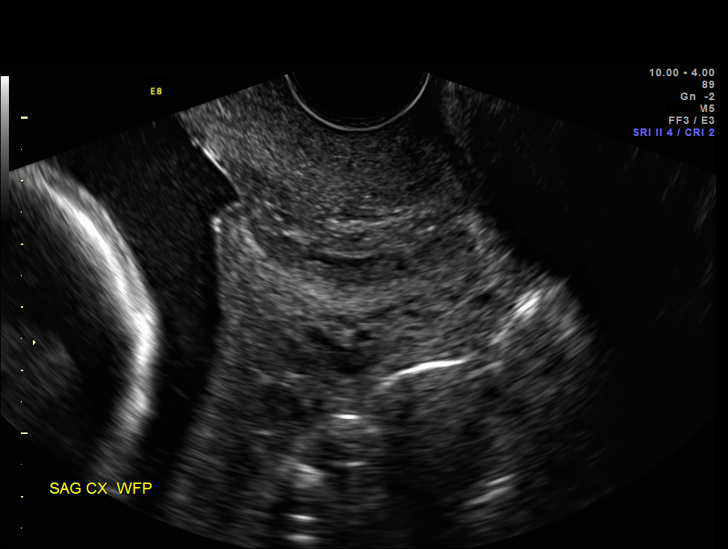
[im 11/14]
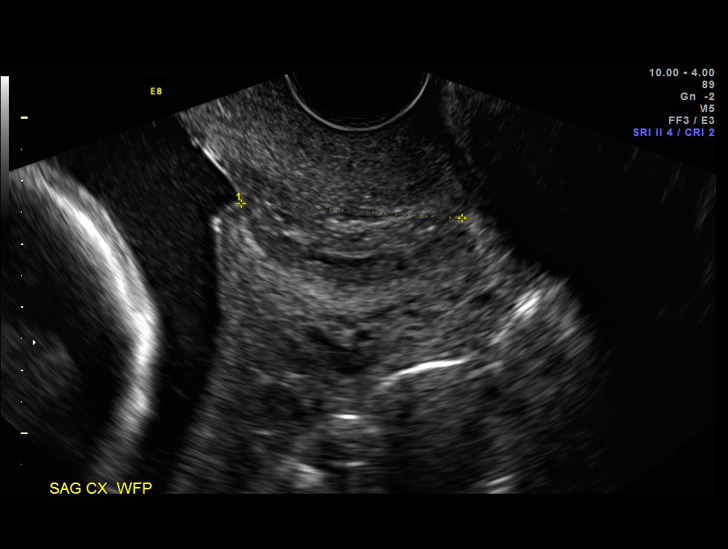
[im 12/14]
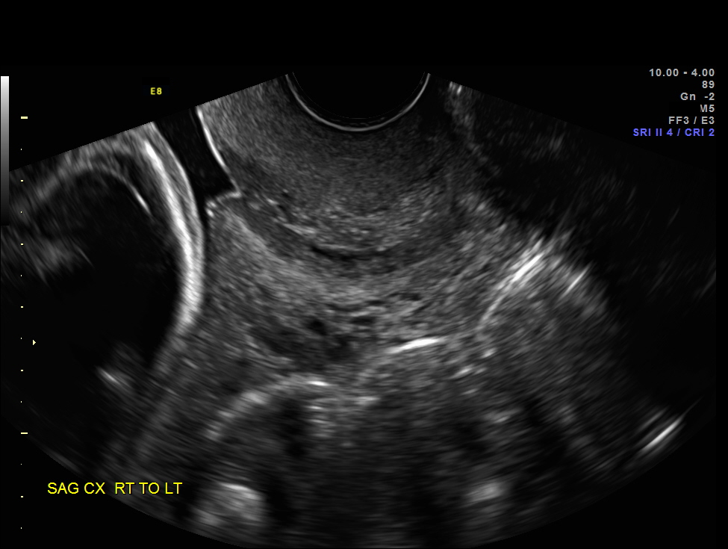
[im 13/14]
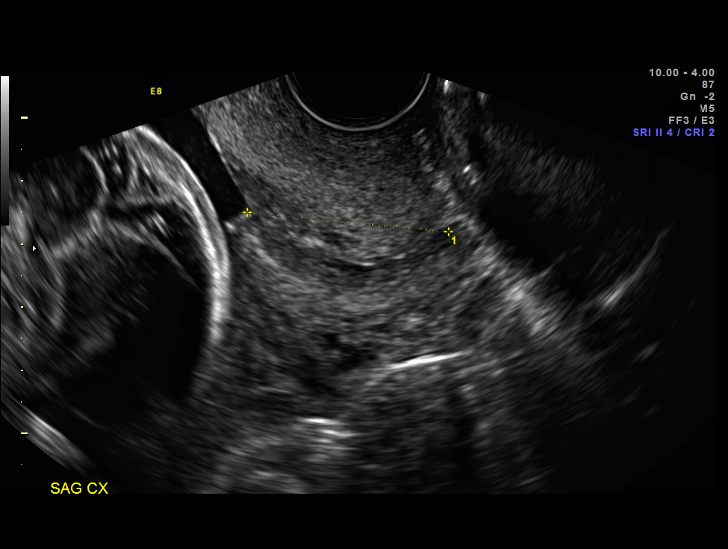
[im 14/14]
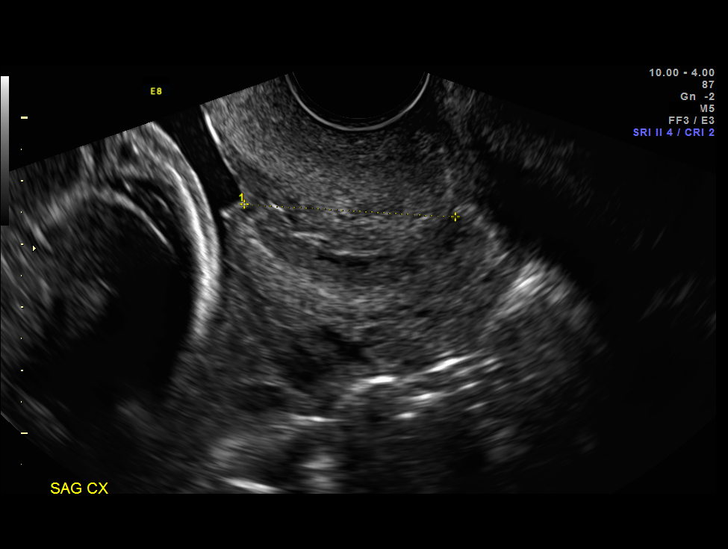

[13 of 14 positions shown; findings below may reference images not displayed]

OBSTETRICS REPORT
(Signed Final 10/20/2014 [DATE])

[facility]

Service(s) Provided

US MFM OB TRANSVAGINAL                                76817.2
Indications

24 weeks gestation of pregnancy
H/O Cervical incompetence, second trimester - 17P
Poor obstetric history: Previous midtrimester loss
(21 weeks)
Uterine fibroids affecting pregnancy in second        O34.12,
trimester, antepartum
Fetal Evaluation

Num Of Fetuses:    1
Fetal Heart Rate:  159                          bpm
Cardiac Activity:  Observed
Presentation:      Cephalic
Placenta:          Posterior Fundal, above
cervical os
P. Cord            Previously Visualized
Insertion:

Amniotic Fluid
AFI FV:      Subjectively within normal limits
Gestational Age

LMP:           24w 1d        Date:  05/04/14                 EDD:   02/08/15
Best:          24w 1d     Det. By:  LMP  (05/04/14)          EDD:   02/08/15
Cervix Uterus Adnexa

Cervical Length:    3.2      cm

Cervix:       Measured transvaginally. Appears closed, without
funnelling.
Impression

Single IUP at 24w 1d
Hx of previous 21 week loss- currently on 17-P injections
Normal amniotic fluid volume
TVUS - cervical length 3.2 cm.  No funneling noted.
Recommendations

Recommend follow-up ultrasound examination in 2 weeks for
cervical length and growth
Is stable at that time, may discontinue cervical length
surveillance

## 2015-09-20 ENCOUNTER — Other Ambulatory Visit: Payer: Self-pay | Admitting: Certified Nurse Midwife

## 2015-12-26 ENCOUNTER — Other Ambulatory Visit: Payer: Self-pay | Admitting: Obstetrics

## 2017-04-03 ENCOUNTER — Ambulatory Visit (INDEPENDENT_AMBULATORY_CARE_PROVIDER_SITE_OTHER): Payer: 59 | Admitting: Obstetrics

## 2017-04-03 ENCOUNTER — Encounter: Payer: Self-pay | Admitting: Obstetrics

## 2017-04-03 DIAGNOSIS — Z23 Encounter for immunization: Secondary | ICD-10-CM

## 2017-04-03 DIAGNOSIS — Z3201 Encounter for pregnancy test, result positive: Secondary | ICD-10-CM

## 2017-04-03 DIAGNOSIS — Z124 Encounter for screening for malignant neoplasm of cervix: Secondary | ICD-10-CM

## 2017-04-03 DIAGNOSIS — Z01419 Encounter for gynecological examination (general) (routine) without abnormal findings: Secondary | ICD-10-CM

## 2017-04-03 DIAGNOSIS — N912 Amenorrhea, unspecified: Secondary | ICD-10-CM

## 2017-04-03 DIAGNOSIS — Z113 Encounter for screening for infections with a predominantly sexual mode of transmission: Secondary | ICD-10-CM

## 2017-04-03 LAB — POCT URINE PREGNANCY: Preg Test, Ur: POSITIVE — AB

## 2017-04-03 MED ORDER — VITAFOL-NANO 18-0.6-0.4 MG PO TABS: 1.0000 | ORAL_TABLET | Freq: Every day | ORAL | 11 refills | Status: DC

## 2017-04-03 NOTE — Progress Notes (Signed)
Subjective:        [name] is a 36 y.o. female here for a routine exam.  Current complaints: None.    Personal health questionnaire:  Is patient [name], have a family history of breast and/or ovarian cancer: no Is there a family history of uterine cancer diagnosed at age < 45, gastrointestinal cancer, urinary tract cancer, family member who is a Field seismologist syndrome-associated carrier: no Is the patient overweight and hypertensive, family history of diabetes, personal history of gestational diabetes, preeclampsia or PCOS: no Is patient over 70, have PCOS,  family history of premature CHD under age 72, diabetes, smoke, have hypertension or peripheral artery disease:  no At any time, has a partner hit, kicked or otherwise hurt or frightened you?: no Over the past 2 weeks, have you felt down, depressed or hopeless?: no Over the past 2 weeks, have you felt little interest or pleasure in doing things?:no   Gynecologic History Patient's last menstrual period was 02/16/2017 (exact date). Contraception: none Last Pap: 2015. Results were: positive High Risk HPV Last mammogram: 2014. Results were: normal  Obstetric History OB History  Gravida Para Term Preterm AB Living  3 3 2 1   2   SAB TAB Ectopic Multiple Live Births        0 2    # Outcome Date GA Lbr Len/2nd Weight Sex Delivery Anes PTL Lv  3 Term 02/02/15 [redacted]w[redacted]d 05:19 / 00:50 6 lb 11.6 oz (3.05 kg) F Vag-Spont None  LIV  2 Preterm 02/23/14 [redacted]w[redacted]d  11.4 oz (0.323 kg) F Vag-Spont None  FD  1 Term 06/27/06 [redacted]w[redacted]d  6 lb (2.722 kg) F Vag-Spont None N LIV      Past Medical History:  Diagnosis Date  . Medical history non-contributory     Past Surgical History:  Procedure Laterality Date  . NO PAST SURGERIES    . VAGINAL DELIVERY  2008   epsiotomy with delivery     Current Outpatient Prescriptions:  .  Prenatal-Fe Fum-Methf-FA w/o A (VITAFOL-NANO) 18-0.6-0.4 MG TABS, Take 1 capsule by mouth daily., Disp: ,  Rfl:  .  Prenatal-Fe Fum-Methf-FA w/o A (VITAFOL-NANO) 18-0.6-0.4 MG TABS, Take 1 tablet by mouth daily., Disp: 30 tablet, Rfl: 11 No current facility-administered medications for this visit.   Facility-Administered Medications Ordered in Other Visits:  .  nitroGLYCERIN for uterine relaxation 200 mcg/mL, , , Anesthesia Intra-op, Brock Ra, CRNA, 600 mcg at 02/03/15 0003 No Known Allergies  Social History  Substance Use Topics  . Smoking status: Never Smoker  . Smokeless tobacco: Never Used  . Alcohol use No    History reviewed. No pertinent family history.    Review of Systems  Constitutional: negative for fatigue and weight loss Respiratory: negative for cough and wheezing Cardiovascular: negative for chest pain, fatigue and palpitations Gastrointestinal: negative for abdominal pain and change in bowel habits Musculoskeletal:negative for myalgias Neurological: negative for gait problems and tremors Behavioral/Psych: negative for abusive relationship, depression Endocrine: negative for temperature intolerance    Genitourinary:negative for abnormal menstrual periods, genital lesions, hot flashes, sexual problems and vaginal discharge Integument/breast: negative for breast lump, breast tenderness, nipple discharge and skin lesion(s)    Objective:       LMP 02/16/2017 (Exact Date)  General:   alert  Skin:   no rash or abnormalities  Lungs:   clear to auscultation bilaterally  Heart:   regular rate and rhythm, S1, S2 normal, no murmur, click, rub or gallop  Breasts:  normal without suspicious masses, skin or nipple changes or axillary nodes  Abdomen:  normal findings: no organomegaly, soft, non-tender and no hernia  Pelvis:  External genitalia: normal general appearance Urinary system: urethral meatus normal and bladder without fullness, nontender Vaginal: normal without tenderness, induration or masses Cervix: normal appearance Adnexa: normal bimanual  exam Uterus: anteverted and non-tender, normal size   Lab Review Urine pregnancy test Labs reviewed yes Radiologic studies reviewed yes  50% of 20 min visit spent on counseling and coordination of care.    Assessment:     1. Encounter for routine gynecological examination with Papanicolaou smear of cervix Rx: - POCT urine pregnancy - Cervicovaginal ancillary only - Cytology - PAP  2. Amenorrhea   3. Positive pregnancy test Rx: - Prenatal-Fe Fum-Methf-FA w/o A (VITAFOL-NANO) 18-0.6-0.4 MG TABS; Take 1 tablet by mouth daily.  Dispense: 30 tablet; Refill: 11  4. Need for vaccination Rx - Flu Vaccine QUAD 36+ mos IM   Plan:    Education reviewed: calcium supplements, depression evaluation, low fat, low cholesterol diet, safe sex/STD prevention, self breast exams and weight bearing exercise. Contraception: none. Follow up in: 6 weeks.   Meds ordered this encounter  Medications  . Prenatal-Fe Fum-Methf-FA w/o A (VITAFOL-NANO) 18-0.6-0.4 MG TABS    Sig: Take 1 capsule by mouth daily.  . Prenatal-Fe Fum-Methf-FA w/o A (VITAFOL-NANO) 18-0.6-0.4 MG TABS    Sig: Take 1 tablet by mouth daily.    Dispense:  30 tablet    Refill:  11   Orders Placed This Encounter  Procedures  . Flu Vaccine QUAD 36+ mos IM  . POCT urine pregnancy

## 2017-04-04 LAB — CYTOLOGY - PAP
DIAGNOSIS: NEGATIVE
HPV (WINDOPATH): NOT DETECTED

## 2017-04-04 LAB — CERVICOVAGINAL ANCILLARY ONLY
Bacterial vaginitis: NEGATIVE
CANDIDA VAGINITIS: NEGATIVE
Chlamydia: NEGATIVE
Neisseria Gonorrhea: NEGATIVE
TRICH (WINDOWPATH): NEGATIVE

## 2017-05-06 ENCOUNTER — Encounter: Payer: Self-pay | Admitting: *Deleted

## 2017-05-06 ENCOUNTER — Ambulatory Visit (INDEPENDENT_AMBULATORY_CARE_PROVIDER_SITE_OTHER): Payer: 59 | Admitting: Certified Nurse Midwife

## 2017-05-06 ENCOUNTER — Encounter: Payer: Self-pay | Admitting: Certified Nurse Midwife

## 2017-05-06 ENCOUNTER — Encounter: Payer: 59 | Admitting: Certified Nurse Midwife

## 2017-05-06 VITALS — BP 110/74 | HR 75 | Wt 142.0 lb

## 2017-05-06 DIAGNOSIS — O09521 Supervision of elderly multigravida, first trimester: Secondary | ICD-10-CM | POA: Diagnosis not present

## 2017-05-06 DIAGNOSIS — O09291 Supervision of pregnancy with other poor reproductive or obstetric history, first trimester: Secondary | ICD-10-CM | POA: Diagnosis not present

## 2017-05-06 DIAGNOSIS — O09529 Supervision of elderly multigravida, unspecified trimester: Secondary | ICD-10-CM | POA: Insufficient documentation

## 2017-05-06 DIAGNOSIS — O0991 Supervision of high risk pregnancy, unspecified, first trimester: Secondary | ICD-10-CM | POA: Diagnosis not present

## 2017-05-06 NOTE — Progress Notes (Signed)
Subjective:   [name] is a 36 y.o. [id number] at [redacted]w[redacted]d by early ultrasound today in office for no fetal heart tones and bleeding in early October, being seen today for her first obstetrical visit.  Her obstetrical history is significant for advanced maternal age and hx of miscarriage at 32 weeks; declined 17-P with previous pregnancy. Previous PPROM @21  weeks 02/2014. Patient does intend to breast feed. Pregnancy history fully reviewed.  Patient reports no complaints.  HISTORY: Obstetric History   G4   P3   T2   P1   A0   L2    SAB0   TAB0   Ectopic0   Multiple0   Live Births2     # Outcome Date GA Lbr Len/2nd Weight Sex Delivery Anes PTL Lv  4 Current           3 Term 02/02/15 [redacted]w[redacted]d 05:19 / 00:50 6 lb 11.6 oz (3.05 kg) F Vag-Spont None  LIV     Name: [name]     Apgar1:  8                Apgar5: 9  2 Preterm 02/23/14 [redacted]w[redacted]d  11.4 oz (0.323 kg) F Vag-Spont None  FD     Name: [name]     Apgar1:  0                Apgar5: 0  1 Term 06/27/06 [redacted]w[redacted]d  6 lb (2.722 kg) F Vag-Spont None N LIV     Past Medical History:  Diagnosis Date  . Medical history non-contributory    Past Surgical History:  Procedure Laterality Date  . NO PAST SURGERIES    . VAGINAL DELIVERY  2008   epsiotomy with delivery   History reviewed. No pertinent family history. Social History   Tobacco Use  . Smoking status: Never Smoker  . Smokeless tobacco: Never Used  Substance Use Topics  . Alcohol use: No    Alcohol/week: 0.0 oz  . Drug use: No   No Known Allergies Current Outpatient Medications on File Prior to Visit  Medication Sig Dispense Refill  . Prenatal-Fe Fum-Methf-FA w/o A (VITAFOL-NANO) 18-0.6-0.4 MG TABS Take 1 capsule by mouth daily.    . Prenatal-Fe Fum-Methf-FA w/o A (VITAFOL-NANO) 18-0.6-0.4 MG TABS Take 1 tablet by mouth daily. 30 tablet 11   Current Facility-Administered Medications on File Prior to Visit  Medication Dose Route Frequency  Provider Last Rate Last Dose  . nitroGLYCERIN for uterine relaxation 200 mcg/mL    Anesthesia Intra-op Brock Ra, CRNA   600 mcg at 02/03/15 0003     Exam   Vitals:   05/06/17 1029  BP: 116/74  Pulse: 75  Weight: 142 lb (64.4 kg)      Uterus:     Pelvic Exam: Perineum: no hemorrhoids, normal perineum   Vulva: normal external genitalia, no lesions   Vagina:  normal mucosa, normal discharge   Cervix: no lesions and normal, pap smear done.    Adnexa: normal adnexa and no mass, fullness, tenderness   Bony Pelvis: average  System: General: well-developed, well-nourished female in no acute distress   Breast:  normal appearance, no masses or tenderness   Skin: normal coloration and turgor, no rashes   Neurologic: oriented, normal, negative, normal mood   Extremities: normal strength, tone, and muscle mass, ROM of all joints is normal   HEENT PERRLA, extraocular movement intact and sclera clear, anicteric   Mouth/Teeth mucous membranes moist, pharynx  normal without lesions and dental hygiene good   Neck supple and no masses   Cardiovascular: regular rate and rhythm   Respiratory:  no respiratory distress, normal breath sounds   Abdomen: soft, non-tender; bowel sounds normal; no masses,  no organomegaly    CRL: [redacted]w[redacted]d on [place] today.  ?small subchorionic hemorrhage and undeterminable fetal heart activity.    Recommendation: F/U US in 14 days for viability.   Assessment:   Pregnancy: [id number] Patient Active Problem List   Diagnosis Date Noted  . Advanced maternal age in multigravida 05/06/2017  . NSVD (normal spontaneous vaginal delivery) 02/03/2015  . Supervision of high-risk pregnancy   . Recurrent pregnancy loss, antepartum condition or complication   . Pregnancy with poor reproductive history   . 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 Supervision of high risk pregnancy in first trimester      - Hemoglobinopathy evaluation - Obstetric Panel,  Including HIV - Culture, OB Urine - Varicella zoster antibody, IgG - VITAMIN D 25 Hydroxy (Vit-D Deficiency, Fractures) - Cystic Fibrosis Mutation 97 - Hemoglobin A1c - Beta HCG, Quant   2. Elderly multigravida in first trimester     <40 years  3. Pregnancy with poor reproductive history in first trimester      ?17-P  Initial labs drawn. Continue prenatal vitamins. Genetic Screening discussed, NIPS: requested. Ultrasound discussed; fetal anatomic survey: requested. Problem list reviewed and updated. The nature of [place] with multiple MDs and other Advanced Practice Providers was explained to patient; also emphasized that residents, students are part of our team. Routine obstetric precautions reviewed. Return in about 2 weeks (around 05/20/2017) for viability scan in office. With routine OB care after viability scan.      Kandis Cocking, [place] for [employer], [place]

## 2017-05-07 LAB — BETA HCG QUANT (REF LAB): HCG QUANT: 18130 m[IU]/mL

## 2017-05-08 LAB — HEMOGLOBINOPATHY EVALUATION
HEMOGLOBIN A2 QUANTITATION: 2.4 % (ref 1.8–3.2)
HEMOGLOBIN F QUANTITATION: 0 % (ref 0.0–2.0)
HGB C: 0 %
HGB S: 0 %
HGB VARIANT: 0 %
Hgb A: 97.6 % (ref 96.4–98.8)

## 2017-05-08 LAB — OBSTETRIC PANEL, INCLUDING HIV
Antibody Screen: NEGATIVE
Basophils Absolute: 0 10*3/uL (ref 0.0–0.2)
Basos: 0 %
EOS (ABSOLUTE): 0.2 10*3/uL (ref 0.0–0.4)
EOS: 4 %
HEP B S AG: NEGATIVE
HIV Screen 4th Generation wRfx: NONREACTIVE
Hematocrit: 38.4 % (ref 34.0–46.6)
Hemoglobin: 12.9 g/dL (ref 11.1–15.9)
IMMATURE GRANS (ABS): 0 10*3/uL (ref 0.0–0.1)
Immature Granulocytes: 0 %
LYMPHS: 31 %
Lymphocytes Absolute: 1.7 10*3/uL (ref 0.7–3.1)
MCH: 29.3 pg (ref 26.6–33.0)
MCHC: 33.6 g/dL (ref 31.5–35.7)
MCV: 87 fL (ref 79–97)
Monocytes Absolute: 0.4 10*3/uL (ref 0.1–0.9)
Monocytes: 8 %
NEUTROS PCT: 57 %
Neutrophils Absolute: 3.2 10*3/uL (ref 1.4–7.0)
Platelets: 249 10*3/uL (ref 150–379)
RBC: 4.4 x10E6/uL (ref 3.77–5.28)
RDW: 14.7 % (ref 12.3–15.4)
RH TYPE: POSITIVE
RPR: NONREACTIVE
Rubella Antibodies, IGG: 27.7 index (ref >0.99)
WBC: 5.6 10*3/uL (ref 3.4–10.8)

## 2017-05-08 LAB — VARICELLA ZOSTER ANTIBODY, IGG: Varicella zoster IgG: <135 index — ABNORMAL LOW (ref >165)

## 2017-05-08 LAB — HEMOGLOBIN A1C
ESTIMATED AVERAGE GLUCOSE: 111 mg/dL
Hgb A1c MFr Bld: 5.5 % (ref 4.8–5.6)

## 2017-05-08 LAB — URINE CULTURE, OB REFLEX

## 2017-05-08 LAB — CULTURE, OB URINE

## 2017-05-10 ENCOUNTER — Other Ambulatory Visit: Payer: Self-pay | Admitting: Certified Nurse Midwife

## 2017-05-10 DIAGNOSIS — Z283 Underimmunization status: Secondary | ICD-10-CM

## 2017-05-10 DIAGNOSIS — O09899 Supervision of other high risk pregnancies, unspecified trimester: Secondary | ICD-10-CM | POA: Insufficient documentation

## 2017-05-10 DIAGNOSIS — Z2839 Other underimmunization status: Secondary | ICD-10-CM | POA: Insufficient documentation

## 2017-05-10 DIAGNOSIS — O0991 Supervision of high risk pregnancy, unspecified, first trimester: Secondary | ICD-10-CM

## 2017-05-10 DIAGNOSIS — E559 Vitamin D deficiency, unspecified: Secondary | ICD-10-CM | POA: Insufficient documentation

## 2017-05-10 MED ORDER — VITAMIN D (ERGOCALCIFEROL) 1.25 MG (50000 UNIT) PO CAPS: 50000.0000 [IU] | ORAL_CAPSULE | ORAL | 2 refills | Status: DC

## 2017-05-12 ENCOUNTER — Encounter: Payer: Self-pay | Admitting: Family Medicine

## 2017-05-12 DIAGNOSIS — Z8759 Personal history of other complications of pregnancy, childbirth and the puerperium: Secondary | ICD-10-CM | POA: Insufficient documentation

## 2017-05-13 ENCOUNTER — Other Ambulatory Visit: Payer: Self-pay | Admitting: Certified Nurse Midwife

## 2017-05-13 DIAGNOSIS — O0991 Supervision of high risk pregnancy, unspecified, first trimester: Secondary | ICD-10-CM

## 2017-05-13 LAB — CYSTIC FIBROSIS MUTATION 97: Interpretation: NOT DETECTED

## 2017-05-15 ENCOUNTER — Inpatient Hospital Stay (HOSPITAL_COMMUNITY)
Admission: AD | Admit: 2017-05-15 | Discharge: 2017-05-16 | Disposition: A | Discharge status: Home / Self Care | Payer: 59 | Source: Ambulatory Visit | Attending: Obstetrics & Gynecology | Admitting: Obstetrics & Gynecology

## 2017-05-15 ENCOUNTER — Encounter (HOSPITAL_COMMUNITY): Payer: Self-pay | Admitting: *Deleted

## 2017-05-15 ENCOUNTER — Inpatient Hospital Stay (HOSPITAL_COMMUNITY): Payer: 59

## 2017-05-15 DIAGNOSIS — O039 Complete or unspecified spontaneous abortion without complication: Secondary | ICD-10-CM | POA: Insufficient documentation

## 2017-05-15 DIAGNOSIS — O209 Hemorrhage in early pregnancy, unspecified: Secondary | ICD-10-CM

## 2017-05-15 DIAGNOSIS — Z3A09 9 weeks gestation of pregnancy: Secondary | ICD-10-CM | POA: Insufficient documentation

## 2017-05-15 LAB — HCG, QUANTITATIVE, PREGNANCY: hCG, Beta Chain, Quant, S: 4054 m[IU]/mL — ABNORMAL HIGH (ref <5)

## 2017-05-15 LAB — CBC
HEMATOCRIT: 38.3 % (ref 36.0–46.0)
Hemoglobin: 12.7 g/dL (ref 12.0–15.0)
MCH: 30 pg (ref 26.0–34.0)
MCHC: 33.2 g/dL (ref 30.0–36.0)
MCV: 90.3 fL (ref 78.0–100.0)
Platelets: 245 10*3/uL (ref 150–400)
RBC: 4.24 MIL/uL (ref 3.87–5.11)
RDW: 13.3 % (ref 11.5–15.5)
WBC: 8.1 10*3/uL (ref 4.0–10.5)

## 2017-05-15 LAB — WET PREP, GENITAL
Clue Cells Wet Prep HPF POC: NONE SEEN
SPERM: NONE SEEN
Trich, Wet Prep: NONE SEEN
Yeast Wet Prep HPF POC: NONE SEEN

## 2017-05-15 NOTE — MAU Note (Addendum)
Pt presents to MAU c/o vaginal bleeding that started @1945 . Pt states she went to the bathroom and was having some abdominal pain and noticed on the tissue that she had some bleeding that looked like the beginning of her menstrual cycle. Pt states she then had a piece pass that she brought with her in a plastic bag. Pt states she then had a gush and pt states she had some water on her pad. Pt states the bleeding then increased. Pt soaked thru her pants and when she went to the bathroom in MAU she started passing clots.   LMP: sept 9th  05/06/17 [place] [redacted]w[redacted]d (questionable dating)

## 2017-05-16 DIAGNOSIS — O039 Complete or unspecified spontaneous abortion without complication: Secondary | ICD-10-CM

## 2017-05-16 LAB — GC/CHLAMYDIA PROBE AMP (~~LOC~~) NOT AT ARMC
Chlamydia: NEGATIVE
NEISSERIA GONORRHEA: NEGATIVE

## 2017-05-16 LAB — HIV ANTIBODY (ROUTINE TESTING W REFLEX): HIV SCREEN 4TH GENERATION: NONREACTIVE

## 2017-05-16 NOTE — MAU Provider Note (Signed)
History  CSN: [id number] Arrival date and time: 05/15/17 2207  First Provider Initiated Contact with Patient 05/15/17 2325      Chief Complaint  Patient presents with  . Vaginal Bleeding    HPI: [name] is a 36 y.o. [id number] with IUP at [redacted]w[redacted]d by 7-wk U/S who presents to maternity admissions reporting vaginal bleeding and cramping. She reports she has been having abdominal cramping on/off for more than a week, then started having some spotting. Started having heavier bleeding tonight that seemed more like a period, also noted something that seemed like tissue. While in the bathroom here she started passing heavy clots. Denies other concerns, including no fever, chills, nausea, vomiting, HA, dizziness or lightheadedness.   She has started [facility]. She had an ultrasound done d/t difficulty finding FHT, which showed IUP at [redacted]w[redacted]d (inconsistent with LMP) and ?small subchorionic hemorrhage and undeterminable fetal heart activity (see office noted from 05/06/17). She was scheduled for a repeat ultrasound for viability in 14 days.  Past obstetric history: OB History  Gravida Para Term Preterm AB Living  4 3 2 1   2   SAB TAB Ectopic Multiple Live Births        0 2    # Outcome Date GA Lbr Len/2nd Weight Sex Delivery Anes PTL Lv  4 Current           3 Term 02/02/15 [redacted]w[redacted]d 05:19 / 00:50 6 lb 11.6 oz (3.05 kg) F Vag-Spont None  LIV  2 Preterm 02/23/14 [redacted]w[redacted]d  11.4 oz (0.323 kg) F Vag-Spont None  FD  1 Term 06/27/06 [redacted]w[redacted]d  6 lb (2.722 kg) F Vag-Spont None N LIV      Past Medical History:  Diagnosis Date  . Medical history non-contributory    Past Surgical History:  Procedure Laterality Date  . NO PAST SURGERIES    . VAGINAL DELIVERY  2008   epsiotomy with delivery   Social History   Socioeconomic History  . Marital status: Single    Spouse name: Not on file  . Number of children: Not on file  . Years of education: Not on file  . Highest education level: Not on file   Social Needs  . Financial resource strain: Not on file  . Food insecurity - worry: Not on file  . Food insecurity - inability: Not on file  . Transportation needs - medical: Not on file  . Transportation needs - non-medical: Not on file  Occupational History  . Not on file  Tobacco Use  . Smoking status: Never Smoker  . Smokeless tobacco: Never Used  Substance and Sexual Activity  . Alcohol use: No    Alcohol/week: 0.0 oz  . Drug use: No  . Sexual activity: Yes    Partners: Male    Birth control/protection: None  Other Topics Concern  . Not on file  Social History Narrative  . Not on file   No Known Allergies  Medications Prior to Admission  Medication Sig Dispense Refill Last Dose  . Prenatal-Fe Fum-Methf-FA w/o A (VITAFOL-NANO) 18-0.6-0.4 MG TABS Take 1 tablet by mouth daily. 30 tablet 11 05/14/2017 at Unknown time  . Prenatal-Fe Fum-Methf-FA w/o A (VITAFOL-NANO) 18-0.6-0.4 MG TABS Take 1 capsule by mouth daily.   Taking  . Vitamin D, Ergocalciferol, (DRISDOL) 50000 units CAPS capsule Take 1 capsule (50,000 Units total) by mouth every 7 (seven) days. 30 capsule 2     I have reviewed patient's Past Medical Hx, Surgical Hx, Family Hx,  Social Hx, medications and allergies.   Review of Systems - Negative except for what is mentioned in HPI.  Physical Exam   Blood pressure 110/80, pulse 94, temperature 98.9 F (37.2 C), temperature source Oral, resp. rate 18, height 5\' 2"  (1.575 m), weight 142 lb 9.6 oz (64.7 kg), last menstrual period 02/16/2017, currently breastfeeding.  Constitutional: Well-developed, well-nourished female in no acute distress.  HENT: [place]/AT, MMM Eyes: normal conjunctivae, no scleral icterus Cardiovascular: normal rate Respiratory: normal effort GI: Abd soft, non-tender, non-distended  Pelvic: NEFG. Normal vaginal mucosa without lesions; cervix appears ~1cm open. On bimanual exam, cervix 1cm, long, firm, no tenderness.  MSK: Extremities nontender, no  edema Neurologic: Alert and oriented x 4. Psych: Normal mood and affect Skin: warm and dry   MAU Course/MDM:   Nursing notes and VS reviewed. Patient seen and examined, as noted above.  Office note from 05/06/17 reviewed, which shows   Cultures collected to r/o pelvic infection Labs ordered: Quant HCG, CBC, ABO/Rh Ultrasound ordered   Results reviewed:  Results for orders placed or performed during the hospital encounter of 05/15/17  Wet prep, genital  Result Value Ref Range   Yeast Wet Prep HPF POC NONE SEEN NONE SEEN   Trich, Wet Prep NONE SEEN NONE SEEN   Clue Cells Wet Prep HPF POC NONE SEEN NONE SEEN   WBC, Wet Prep HPF POC FEW (A) NONE SEEN   Sperm NONE SEEN   CBC  Result Value Ref Range   WBC 8.1 4.0 - 10.5 K/uL   RBC 4.24 3.87 - 5.11 MIL/uL   Hemoglobin 12.7 12.0 - 15.0 g/dL   HCT 38.3 36.0 - 46.0 %   MCV 90.3 78.0 - 100.0 fL   MCH 30.0 26.0 - 34.0 pg   MCHC 33.2 30.0 - 36.0 g/dL   RDW 13.3 11.5 - 15.5 %   Platelets 245 150 - 400 K/uL  hCG, quantitative, pregnancy  Result Value Ref Range   hCG, Beta Chain, Quant, S 4,054 (H) <5 mIU/mL   Beta Hcg 4,054 from 18,130 on 05/06/17  US OB Transvaginal CLINICAL DATA:  35 year old female with positive HCG level and reported IUP on a prior ultrasound in of phase on 05/06/2017. Patient presents with vaginal bleeding.  EXAM: OBSTETRIC <14 WK [place] AND TRANSVAGINAL OB US  TECHNIQUE: Both transabdominal and transvaginal ultrasound examinations were performed for complete evaluation of the gestation as well as the maternal uterus, adnexal regions, and pelvic cul-de-sac. Transvaginal technique was performed to assess early pregnancy.  COMPARISON:  None.  FINDINGS: The uterus is retroverted. Endometrium is heterogeneous and thickened measuring 2.3 cm in thickness. No intrauterine pregnancy identified. Heterogeneous and echogenic content within the endometrial canal most consistent with blood product.  Retained products of conception is not excluded. Correlation with clinical exam and follow-up with HCG levels recommended.  The ovaries appear unremarkable. The right ovary measures 2.7 x 1.6 x 1.3 cm and the left ovary measures 3.9 x 2.8 x 2.9 cm.  No significant free fluid within the pelvis.  IMPRESSION: 1. No intrauterine pregnancy. 2. Heterogeneous content within the uterus most consistent with blood product. If there is documented prior IUP by ultrasound, findings most consistent with spontaneous abortion. Retained products of conception is not excluded. Clinical correlation and follow-up with serial HCG levels recommended. 3. Unremarkable ovaries.  Electronically Signed   By: Anner Crete M.D.   On: 05/16/2017 01:35   --Discussed results c/w SAB with patient. Discussed precautions and follow up in 2  weeks.   --Rh positive  Assessment and Plan  Assessment: 1. Complete miscarriage   2. Bleeding in early pregnancy     Plan: --Discharge home in stable condition.  --Follow up on miscarriage outpatient in about 2 week. Message sent to [place] admin pool.    Giana Castner, Jenne Pane, MD 05/16/2017 12:41 AM

## 2017-05-16 NOTE — Discharge Instructions (Signed)

## 2017-05-21 ENCOUNTER — Other Ambulatory Visit: Payer: 59

## 2017-05-28 ENCOUNTER — Other Ambulatory Visit: Payer: 59

## 2017-05-30 ENCOUNTER — Ambulatory Visit: Payer: 59 | Admitting: Certified Nurse Midwife

## 2017-06-04 ENCOUNTER — Encounter: Payer: Self-pay | Admitting: Obstetrics

## 2017-06-04 ENCOUNTER — Ambulatory Visit (INDEPENDENT_AMBULATORY_CARE_PROVIDER_SITE_OTHER): Payer: 59 | Admitting: Obstetrics

## 2017-06-04 VITALS — BP 111/77 | HR 71 | Wt 138.4 lb

## 2017-06-04 DIAGNOSIS — O039 Complete or unspecified spontaneous abortion without complication: Secondary | ICD-10-CM | POA: Diagnosis not present

## 2017-06-04 DIAGNOSIS — Z3169 Encounter for other general counseling and advice on procreation: Secondary | ICD-10-CM | POA: Diagnosis not present

## 2017-06-04 DIAGNOSIS — Z3009 Encounter for other general counseling and advice on contraception: Secondary | ICD-10-CM

## 2017-06-04 MED ORDER — PRENATAL PLUS 27-1 MG PO TABS: 1.0000 | ORAL_TABLET | Freq: Every day | ORAL | 11 refills | Status: DC

## 2017-06-04 NOTE — Progress Notes (Signed)
Patient ID: [name], female   DOB: 06/28/1980, 36 y.o.   MRN: [id number]  Chief Complaint  Patient presents with  . Follow-up    HPI [name] is a 36 y.o. female.  S/P Complete SAB ~ 2 weeks ago.  No complaints.  Denies heavy vaginal bleeding or cramping. HPI  Past Medical History:  Diagnosis Date  . Medical history non-contributory     Past Surgical History:  Procedure Laterality Date  . NO PAST SURGERIES    . VAGINAL DELIVERY  2008   epsiotomy with delivery    History reviewed. No pertinent family history.  Social History Social History   Tobacco Use  . Smoking status: Never Smoker  . Smokeless tobacco: Never Used  Substance Use Topics  . Alcohol use: No    Alcohol/week: 0.0 oz  . Drug use: No    No Known Allergies  Current Outpatient Medications  Medication Sig Dispense Refill  . prenatal vitamin w/FE, FA (PRENATAL 1 + 1) 27-1 MG TABS tablet Take 1 tablet by mouth daily before breakfast. 30 each 11  . Prenatal-Fe Fum-Methf-FA w/o A (VITAFOL-NANO) 18-0.6-0.4 MG TABS Take 1 tablet by mouth daily. 30 tablet 11  . Vitamin D, Ergocalciferol, (DRISDOL) 50000 units CAPS capsule Take 1 capsule (50,000 Units total) by mouth every 7 (seven) days. 30 capsule 2   No current facility-administered medications for this visit.    Facility-Administered Medications Ordered in Other Visits  Medication Dose Route Frequency Provider Last Rate Last Dose  . nitroGLYCERIN for uterine relaxation 200 mcg/mL    Anesthesia Intra-op Brock Ra, CRNA   600 mcg at 02/03/15 0003    Review of Systems Review of Systems Constitutional: negative for fatigue and weight loss Respiratory: negative for cough and wheezing Cardiovascular: negative for chest pain, fatigue and palpitations Gastrointestinal: negative for abdominal pain and change in bowel habits Genitourinary:negative Integument/breast: negative for nipple discharge Musculoskeletal:negative  for myalgias Neurological: negative for gait problems and tremors Behavioral/Psych: negative for abusive relationship, depression Endocrine: negative for temperature intolerance      Blood pressure 111/77, pulse 71, weight 138 lb 6.4 oz (62.8 kg), last menstrual period 02/16/2017, currently breastfeeding.  Physical Exam Physical Exam:  Deferred  >50% of 15 min visit spent on counseling and coordination of care.   Data Reviewed Ultrasound  Assessment     1. SAB (spontaneous abortion) - Complete - doing well emotionally and physically   2. Encounter for other general counseling and advice on contraception - declined contraception.  Wants another baby.  3. Encounter for preconception consultation Rx: - prenatal vitamin w/FE, FA (PRENATAL 1 + 1) 27-1 MG TABS tablet; Take 1 tablet by mouth daily before breakfast.  Dispense: 30 each; Refill: 11  Plan    Follow up in 4 weeks  No orders of the defined types were placed in this encounter.  Meds ordered this encounter  Medications  . prenatal vitamin w/FE, FA (PRENATAL 1 + 1) 27-1 MG TABS tablet    Sig: Take 1 tablet by mouth daily before breakfast.    Dispense:  30 each    Refill:  11

## 2017-07-02 ENCOUNTER — Encounter: Payer: Self-pay | Admitting: Obstetrics

## 2017-07-02 ENCOUNTER — Ambulatory Visit (INDEPENDENT_AMBULATORY_CARE_PROVIDER_SITE_OTHER): Payer: 59 | Admitting: Obstetrics

## 2017-07-02 VITALS — BP 122/78 | HR 84 | Ht 62.0 in | Wt 141.4 lb

## 2017-07-02 DIAGNOSIS — Z3009 Encounter for other general counseling and advice on contraception: Secondary | ICD-10-CM | POA: Diagnosis not present

## 2017-07-02 DIAGNOSIS — O039 Complete or unspecified spontaneous abortion without complication: Secondary | ICD-10-CM

## 2017-07-02 NOTE — Progress Notes (Signed)
Presents for SAB follow up.

## 2017-07-03 ENCOUNTER — Encounter: Payer: Self-pay | Admitting: Obstetrics

## 2017-07-03 NOTE — Progress Notes (Signed)
Patient ID: [name], female   DOB: Jul 28, 1980, 37 y.o.   MRN: [id number]  No chief complaint on file.   HPI [name] is a 37 y.o. female.  Presents for follow up 2 weeks after SAB, complete.  No complaints. HPI  Past Medical History:  Diagnosis Date  . Medical history non-contributory     Past Surgical History:  Procedure Laterality Date  . NO PAST SURGERIES    . VAGINAL DELIVERY  2008   epsiotomy with delivery    History reviewed. No pertinent family history.  Social History Social History   Tobacco Use  . Smoking status: Never Smoker  . Smokeless tobacco: Never Used  Substance Use Topics  . Alcohol use: No    Alcohol/week: 0.0 oz  . Drug use: No    No Known Allergies  Current Outpatient Medications  Medication Sig Dispense Refill  . prenatal vitamin w/FE, FA (PRENATAL 1 + 1) 27-1 MG TABS tablet Take 1 tablet by mouth daily before breakfast. 30 each 11  . Prenatal-Fe Fum-Methf-FA w/o A (VITAFOL-NANO) 18-0.6-0.4 MG TABS Take 1 tablet by mouth daily. 30 tablet 11  . Vitamin D, Ergocalciferol, (DRISDOL) 50000 units CAPS capsule Take 1 capsule (50,000 Units total) by mouth every 7 (seven) days. 30 capsule 2   No current facility-administered medications for this visit.    Facility-Administered Medications Ordered in Other Visits  Medication Dose Route Frequency Provider Last Rate Last Dose  . nitroGLYCERIN for uterine relaxation 200 mcg/mL    Anesthesia Intra-op Brock Ra, CRNA   600 mcg at 02/03/15 0003    Review of Systems Review of Systems Constitutional: negative for fatigue and weight loss Respiratory: negative for cough and wheezing Cardiovascular: negative for chest pain, fatigue and palpitations Gastrointestinal: negative for abdominal pain and change in bowel habits Genitourinary:negative Integument/breast: negative for nipple discharge Musculoskeletal:negative for myalgias Neurological: negative for gait  problems and tremors Behavioral/Psych: negative for abusive relationship, depression Endocrine: negative for temperature intolerance      Blood pressure 122/78, pulse 84, height 5\' 2"  (1.575 m), weight 141 lb 6.4 oz (64.1 kg), last menstrual period 06/11/2017, not currently breastfeeding.  Physical Exam Physical Exam General:   alert  Skin:   no rash or abnormalities  Lungs:   clear to auscultation bilaterally  Heart:   regular rate and rhythm, S1, S2 normal, no murmur, click, rub or gallop  Breasts:   normal without suspicious masses, skin or nipple changes or axillary nodes  Abdomen:  normal findings: no organomegaly, soft, non-tender and no hernia  Pelvis:  External genitalia: normal general appearance Urinary system: urethral meatus normal and bladder without fullness, nontender Vaginal: normal without tenderness, induration or masses Cervix: normal appearance Adnexa: normal bimanual exam Uterus: anteverted and non-tender, normal size    50% of 15 min visit spent on counseling and coordination of care.   Data Reviewed Labs  Assessment     1. SAB (spontaneous abortion), complete - doing well  2. Encounter for other general counseling and advice on contraception - declines contraception    Plan    Follow up in 10 months for annual  No orders of the defined types were placed in this encounter.  No orders of the defined types were placed in this encounter.   [name] MD

## 2017-10-08 ENCOUNTER — Other Ambulatory Visit: Payer: Self-pay

## 2017-10-08 DIAGNOSIS — O039 Complete or unspecified spontaneous abortion without complication: Secondary | ICD-10-CM

## 2017-10-08 MED ORDER — PRENATAL PLUS 27-1 MG PO TABS: 1.0000 | ORAL_TABLET | Freq: Every day | ORAL | 11 refills | Status: DC

## 2017-10-08 NOTE — Progress Notes (Signed)
Pt never received Rx for Prenatal vitamins.

## 2018-07-16 ENCOUNTER — Encounter (HOSPITAL_COMMUNITY): Payer: Self-pay | Admitting: Emergency Medicine

## 2018-07-16 ENCOUNTER — Emergency Department (HOSPITAL_COMMUNITY)
Admission: EM | Admit: 2018-07-16 | Discharge: 2018-07-16 | Disposition: A | Discharge status: Home / Self Care | Payer: No Typology Code available for payment source | Attending: Emergency Medicine | Admitting: Emergency Medicine

## 2018-07-16 ENCOUNTER — Other Ambulatory Visit: Payer: Self-pay

## 2018-07-16 ENCOUNTER — Emergency Department (HOSPITAL_COMMUNITY): Payer: No Typology Code available for payment source

## 2018-07-16 DIAGNOSIS — M545 Low back pain, unspecified: Secondary | ICD-10-CM

## 2018-07-16 DIAGNOSIS — Z79899 Other long term (current) drug therapy: Secondary | ICD-10-CM | POA: Diagnosis not present

## 2018-07-16 DIAGNOSIS — Y92410 Unspecified street and highway as the place of occurrence of the external cause: Secondary | ICD-10-CM | POA: Diagnosis not present

## 2018-07-16 DIAGNOSIS — M542 Cervicalgia: Secondary | ICD-10-CM | POA: Diagnosis present

## 2018-07-16 DIAGNOSIS — Y999 Unspecified external cause status: Secondary | ICD-10-CM | POA: Insufficient documentation

## 2018-07-16 DIAGNOSIS — Y939 Activity, unspecified: Secondary | ICD-10-CM | POA: Diagnosis not present

## 2018-07-16 LAB — RAPID URINE DRUG SCREEN, HOSP PERFORMED
AMPHETAMINES: NOT DETECTED
Barbiturates: NOT DETECTED
Benzodiazepines: NOT DETECTED
Cocaine: NOT DETECTED
Opiates: NOT DETECTED
Tetrahydrocannabinol: NOT DETECTED

## 2018-07-16 LAB — POC URINE PREG, ED: Preg Test, Ur: NEGATIVE

## 2018-07-16 MED ORDER — METHOCARBAMOL 500 MG PO TABS: 500.0000 mg | ORAL_TABLET | Freq: Two times a day (BID) | ORAL | 0 refills | Status: AC

## 2018-07-16 MED ORDER — NAPROXEN 500 MG PO TABS: 500.0000 mg | ORAL_TABLET | Freq: Two times a day (BID) | ORAL | 0 refills | Status: AC

## 2018-07-16 MED ORDER — NAPROXEN 250 MG PO TABS
500.0000 mg | ORAL_TABLET | Freq: Once | ORAL | Status: AC
  Administered 2018-07-16: 500 mg via ORAL
  Filled 2018-07-16: qty 2

## 2018-07-16 NOTE — Discharge Instructions (Signed)
The xray of your neck, back, chest was negative today. Have prescribed muscle relaxers for your pain, please do not drink or drive while taking this medications as they can make you drowsy.  Please follow up with PCP in 1 week for reevaluation of your symptoms.  You experience any bowel or bladder incontinence, fever, worsening in your symptoms please return to the ED.

## 2018-07-16 NOTE — ED Notes (Signed)
Patient transported to X-ray 
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
## 2018-07-16 NOTE — ED Notes (Signed)
ED Provider at bedside. 
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
## 2019-03-11 ENCOUNTER — Ambulatory Visit: Payer: Self-pay | Admitting: Obstetrics

## 2019-03-11 ENCOUNTER — Ambulatory Visit: Payer: Self-pay | Admitting: Certified Nurse Midwife

## 2019-03-18 ENCOUNTER — Other Ambulatory Visit: Payer: Self-pay

## 2019-03-18 ENCOUNTER — Other Ambulatory Visit (HOSPITAL_COMMUNITY)
Admission: RE | Admit: 2019-03-18 | Discharge: 2019-03-18 | Disposition: A | Discharge status: Home / Self Care | Payer: Medicaid Other | Source: Ambulatory Visit | Attending: Obstetrics | Admitting: Obstetrics

## 2019-03-18 ENCOUNTER — Encounter: Payer: Self-pay | Admitting: Obstetrics

## 2019-03-18 ENCOUNTER — Ambulatory Visit: Payer: Medicaid Other | Admitting: Obstetrics

## 2019-03-18 VITALS — BP 117/80 | HR 79 | Ht 62.0 in | Wt 146.0 lb

## 2019-03-18 DIAGNOSIS — Z113 Encounter for screening for infections with a predominantly sexual mode of transmission: Secondary | ICD-10-CM

## 2019-03-18 DIAGNOSIS — Z0000 Encounter for general adult medical examination without abnormal findings: Secondary | ICD-10-CM | POA: Diagnosis not present

## 2019-03-18 DIAGNOSIS — Z124 Encounter for screening for malignant neoplasm of cervix: Secondary | ICD-10-CM | POA: Insufficient documentation

## 2019-03-18 DIAGNOSIS — Z01419 Encounter for gynecological examination (general) (routine) without abnormal findings: Secondary | ICD-10-CM

## 2019-03-18 DIAGNOSIS — Z3201 Encounter for pregnancy test, result positive: Secondary | ICD-10-CM

## 2019-03-18 DIAGNOSIS — B369 Superficial mycosis, unspecified: Secondary | ICD-10-CM

## 2019-03-18 DIAGNOSIS — L309 Dermatitis, unspecified: Secondary | ICD-10-CM

## 2019-03-18 MED ORDER — CLOTRIMAZOLE-BETAMETHASONE 1-0.05 % EX CREA: 1.0000 "application " | TOPICAL_CREAM | Freq: Two times a day (BID) | CUTANEOUS | 1 refills | Status: DC

## 2019-03-18 MED ORDER — AZITHROMYCIN 250 MG PO TABS: ORAL_TABLET | ORAL | 2 refills | Status: DC

## 2019-03-18 MED ORDER — VITAFOL-NANO 18-0.6-0.4 MG PO TABS: 1.0000 | ORAL_TABLET | Freq: Every day | ORAL | 4 refills | Status: DC

## 2019-03-18 NOTE — Progress Notes (Signed)
Subjective:        [name] is a 38 y.o. female here for a routine exam.  Current complaints: Itchy rash on arms and legs.    Personal health questionnaire:  Is patient [name], have a family history of breast and/or ovarian cancer: no Is there a family history of uterine cancer diagnosed at age < 33, gastrointestinal cancer, urinary tract cancer, family member who is a Field seismologist syndrome-associated carrier: no Is the patient overweight and hypertensive, family history of diabetes, personal history of gestational diabetes, preeclampsia or PCOS: no Is patient over 45, have PCOS,  family history of premature CHD under age 77, diabetes, smoke, have hypertension or peripheral artery disease:  no At any time, has a partner hit, kicked or otherwise hurt or frightened you?: no Over the past 2 weeks, have you felt down, depressed or hopeless?: no Over the past 2 weeks, have you felt little interest or pleasure in doing things?:no   Gynecologic History Patient's last menstrual period was 03/10/2019. Contraception: none Last Pap: 04-03-2017. Results were: normal Last mammogram: n/a. Results were: n/a  Obstetric History OB History  Gravida Para Term Preterm AB Living  4 3 2 1   2   SAB TAB Ectopic Multiple Live Births        0 2    # Outcome Date GA Lbr Len/2nd Weight Sex Delivery Anes PTL Lv  4 Gravida           3 Term 02/02/15 [redacted]w[redacted]d 05:19 / 00:50 6 lb 11.6 oz (3.05 kg) F Vag-Spont None  LIV  2 Preterm 02/23/14 [redacted]w[redacted]d  11.4 oz (0.323 kg) F Vag-Spont None  FD  1 Term 06/27/06 [redacted]w[redacted]d  6 lb (2.722 kg) F Vag-Spont None N LIV    Past Medical History:  Diagnosis Date  . Medical history non-contributory     Past Surgical History:  Procedure Laterality Date  . NO PAST SURGERIES    . VAGINAL DELIVERY  2008   epsiotomy with delivery     Current Outpatient Medications:  .  azithromycin (ZITHROMAX Z-PAK) 250 MG tablet, Take as directed., Disp: 1 each, Rfl: 2 .   clotrimazole-betamethasone (LOTRISONE) cream, Apply 1 application topically 2 (two) times daily., Disp: 45 g, Rfl: 1 .  prenatal vitamin w/FE, FA (PRENATAL 1 + 1) 27-1 MG TABS tablet, Take 1 tablet by mouth daily before breakfast. (Patient not taking: Reported on 03/18/2019), Disp: 30 each, Rfl: 11 .  Prenatal-Fe Fum-Methf-FA w/o A (VITAFOL-NANO) 18-0.6-0.4 MG TABS, Take 1 tablet by mouth daily., Disp: 90 tablet, Rfl: 4 .  Vitamin D, Ergocalciferol, (DRISDOL) 50000 units CAPS capsule, Take 1 capsule (50,000 Units total) by mouth every 7 (seven) days. (Patient not taking: Reported on 03/18/2019), Disp: 30 capsule, Rfl: 2 No current facility-administered medications for this visit.   Facility-Administered Medications Ordered in Other Visits:  .  nitroGLYCERIN for uterine relaxation 200 mcg/mL, , , Anesthesia Intra-op, Brock Ra, CRNA, 600 mcg at 02/03/15 0003 No Known Allergies  Social History   Tobacco Use  . Smoking status: Never Smoker  . Smokeless tobacco: Never Used  Substance Use Topics  . Alcohol use: No    Alcohol/week: 0.0 standard drinks    History reviewed. No pertinent family history.    Review of Systems  Constitutional: negative for fatigue and weight loss Respiratory: negative for cough and wheezing Cardiovascular: negative for chest pain, fatigue and palpitations Gastrointestinal: negative for abdominal pain and change in bowel habits Musculoskeletal:negative for myalgias Neurological:  negative for gait problems and tremors Behavioral/Psych: negative for abusive relationship, depression Endocrine: negative for temperature intolerance    Genitourinary:negative for abnormal menstrual periods, genital lesions, hot flashes, sexual problems and vaginal discharge Integument/breast: negative for breast lump, breast tenderness, nipple discharge and skin lesion(s)    Objective:       BP 117/80   Pulse 79   Ht 5\' 2"  (1.575 m)   Wt 146 lb (66.2 kg)   LMP  03/10/2019   BMI 26.70 kg/m  General:   alert  Skin:   no rash or abnormalities  Lungs:   clear to auscultation bilaterally  Heart:   regular rate and rhythm, S1, S2 normal, no murmur, click, rub or gallop  Breasts:   normal without suspicious masses, skin or nipple changes or axillary nodes  Abdomen:  normal findings: no organomegaly, soft, non-tender and no hernia  Pelvis:  External genitalia: normal general appearance Urinary system: urethral meatus normal and bladder without fullness, nontender Vaginal: normal without tenderness, induration or masses Cervix: normal appearance Adnexa: normal bimanual exam Uterus: anteverted and non-tender, normal size   Lab Review Urine pregnancy test Labs reviewed yes Radiologic studies reviewed no  50% of 25 min visit spent on counseling and coordination of care.   Assessment:    Healthy female exam.    Plan:    Education reviewed: calcium supplements, depression evaluation, low fat, low cholesterol diet, safe sex/STD prevention, self breast exams and weight bearing exercise. Contraception: none. Follow up in: 1 year.   Meds ordered this encounter  Medications  . Prenatal-Fe Fum-Methf-FA w/o A (VITAFOL-NANO) 18-0.6-0.4 MG TABS    Sig: Take 1 tablet by mouth daily.    Dispense:  90 tablet    Refill:  4  . clotrimazole-betamethasone (LOTRISONE) cream    Sig: Apply 1 application topically 2 (two) times daily.    Dispense:  45 g    Refill:  1  . azithromycin (ZITHROMAX Z-PAK) 250 MG tablet    Sig: Take as directed.    Dispense:  1 each    Refill:  2   Orders Placed This Encounter  Procedures  . Hepatitis B surface antigen  . Hepatitis C antibody  . RPR  . HIV Antibody (routine testing w rflx)  . TSH  . Comprehensive metabolic panel  . CBC with Differential/Platelet  . VITAMIN D 25 Hydroxy (Vit-D Deficiency, Fractures)  . Lipid Profile    Standing Status:   Future    Standing Expiration Date:   03/17/2020    [name], MD 03/18/2019 4:34 PM   Patient Presents for Annual Exam Today.   Last Pap:04/03/2017 WNL  LMP:03/10/19 Contraception:None  STD Screening: Desires   Pt wants refill on prenatal vitamins 90 days w/ yr refill (vitafol nano)

## 2019-03-19 LAB — RPR: RPR Ser Ql: NONREACTIVE

## 2019-03-19 LAB — COMPREHENSIVE METABOLIC PANEL
ALT: 13 IU/L (ref 0–32)
AST: 22 IU/L (ref 0–40)
Albumin/Globulin Ratio: 1.6 (ref 1.2–2.2)
Albumin: 4.5 g/dL (ref 3.8–4.8)
Alkaline Phosphatase: 42 IU/L (ref 39–117)
BUN/Creatinine Ratio: 13 (ref 9–23)
BUN: 9 mg/dL (ref 6–20)
Bilirubin Total: <0.2 mg/dL (ref 0.0–1.2)
CO2: 24 mmol/L (ref 20–29)
Calcium: 9.2 mg/dL (ref 8.7–10.2)
Chloride: 103 mmol/L (ref 96–106)
Creatinine, Ser: 0.68 mg/dL (ref 0.57–1.00)
GFR calc Af Amer: 128 mL/min/{1.73_m2} (ref >59)
GFR calc non Af Amer: 111 mL/min/{1.73_m2} (ref >59)
Globulin, Total: 2.8 g/dL (ref 1.5–4.5)
Glucose: 89 mg/dL (ref 65–99)
Potassium: 4.2 mmol/L (ref 3.5–5.2)
Sodium: 140 mmol/L (ref 134–144)
Total Protein: 7.3 g/dL (ref 6.0–8.5)

## 2019-03-19 LAB — HIV ANTIBODY (ROUTINE TESTING W REFLEX): HIV Screen 4th Generation wRfx: NONREACTIVE

## 2019-03-19 LAB — HEPATITIS B SURFACE ANTIGEN: Hepatitis B Surface Ag: NEGATIVE

## 2019-03-19 LAB — CBC WITH DIFFERENTIAL/PLATELET
Basophils Absolute: 0 10*3/uL (ref 0.0–0.2)
Basos: 1 %
EOS (ABSOLUTE): 0.2 10*3/uL (ref 0.0–0.4)
Eos: 5 %
Hematocrit: 37.6 % (ref 34.0–46.6)
Hemoglobin: 12.6 g/dL (ref 11.1–15.9)
Immature Grans (Abs): 0 10*3/uL (ref 0.0–0.1)
Immature Granulocytes: 0 %
Lymphocytes Absolute: 2.1 10*3/uL (ref 0.7–3.1)
Lymphs: 45 %
MCH: 29.6 pg (ref 26.6–33.0)
MCHC: 33.5 g/dL (ref 31.5–35.7)
MCV: 88 fL (ref 79–97)
Monocytes Absolute: 0.4 10*3/uL (ref 0.1–0.9)
Monocytes: 9 %
Neutrophils Absolute: 1.8 10*3/uL (ref 1.4–7.0)
Neutrophils: 40 %
Platelets: 255 10*3/uL (ref 150–450)
RBC: 4.26 x10E6/uL (ref 3.77–5.28)
RDW: 12.6 % (ref 11.7–15.4)
WBC: 4.6 10*3/uL (ref 3.4–10.8)

## 2019-03-19 LAB — HEPATITIS C ANTIBODY: Hep C Virus Ab: <0.1 s/co ratio (ref 0.0–0.9)

## 2019-03-19 LAB — VITAMIN D 25 HYDROXY (VIT D DEFICIENCY, FRACTURES): Vit D, 25-Hydroxy: 19.7 ng/mL — ABNORMAL LOW (ref 30.0–100.0)

## 2019-03-19 LAB — TSH: TSH: 0.883 u[IU]/mL (ref 0.450–4.500)

## 2019-03-23 ENCOUNTER — Other Ambulatory Visit (INDEPENDENT_AMBULATORY_CARE_PROVIDER_SITE_OTHER): Payer: Medicaid Other

## 2019-03-23 DIAGNOSIS — Z3202 Encounter for pregnancy test, result negative: Secondary | ICD-10-CM | POA: Diagnosis not present

## 2019-03-23 DIAGNOSIS — Z0000 Encounter for general adult medical examination without abnormal findings: Secondary | ICD-10-CM

## 2019-03-23 DIAGNOSIS — Z23 Encounter for immunization: Secondary | ICD-10-CM

## 2019-03-23 DIAGNOSIS — Z3200 Encounter for pregnancy test, result unknown: Secondary | ICD-10-CM

## 2019-03-23 LAB — POCT URINE PREGNANCY: Preg Test, Ur: NEGATIVE

## 2019-03-23 NOTE — Progress Notes (Signed)
UPT:negative

## 2019-03-23 NOTE — Progress Notes (Signed)
During lab visit, pt requested flu shot. Flu shot given L Del w/o difficulty.

## 2019-03-23 NOTE — Progress Notes (Signed)
Patient seen and assessed by nursing staff during this encounter. I have reviewed the chart and agree with the documentation and plan.  Mora Bellman, MD 03/23/2019 1:53 PM

## 2019-03-24 ENCOUNTER — Other Ambulatory Visit: Payer: Self-pay | Admitting: Obstetrics

## 2019-03-24 DIAGNOSIS — N946 Dysmenorrhea, unspecified: Secondary | ICD-10-CM

## 2019-03-24 LAB — LIPID PANEL
Chol/HDL Ratio: 3.3 ratio (ref 0.0–4.4)
Cholesterol, Total: 167 mg/dL (ref 100–199)
HDL: 50 mg/dL (ref >39)
LDL Chol Calc (NIH): 106 mg/dL — ABNORMAL HIGH (ref 0–99)
Triglycerides: 56 mg/dL (ref 0–149)
VLDL Cholesterol Cal: 11 mg/dL (ref 5–40)

## 2019-03-24 MED ORDER — IBUPROFEN 800 MG PO TABS: 800.0000 mg | ORAL_TABLET | Freq: Three times a day (TID) | ORAL | 5 refills | Status: DC | PRN

## 2019-03-29 ENCOUNTER — Telehealth: Payer: Self-pay

## 2019-03-29 ENCOUNTER — Other Ambulatory Visit: Payer: Self-pay | Admitting: Obstetrics

## 2019-03-29 DIAGNOSIS — A749 Chlamydial infection, unspecified: Secondary | ICD-10-CM

## 2019-03-29 LAB — CERVICOVAGINAL ANCILLARY ONLY
Bacterial Vaginitis (gardnerella): NEGATIVE
Candida Glabrata: NEGATIVE
Candida Vaginitis: NEGATIVE
Chlamydia: POSITIVE — AB
Comment: NEGATIVE
Comment: NEGATIVE
Comment: NEGATIVE
Comment: NEGATIVE
Comment: NEGATIVE
Comment: NORMAL
Neisseria Gonorrhea: NEGATIVE
Trichomonas: NEGATIVE

## 2019-03-29 MED ORDER — AZITHROMYCIN 500 MG PO TABS: 1000.0000 mg | ORAL_TABLET | Freq: Once | ORAL | 0 refills | Status: AC

## 2019-03-29 MED ORDER — CEFIXIME 400 MG PO CAPS: 400.0000 mg | ORAL_CAPSULE | Freq: Once | ORAL | 0 refills | Status: AC

## 2019-03-29 NOTE — Telephone Encounter (Signed)
Patient called and reports that she does "not believe the results of her last swab" referring to positive chlamydia result patient received. Pt would like to come back in and be retested. Dr. Jodi Mourning recommends patient return for self swab. Pt advised and transferred call up front for appointment scheduling.

## 2019-03-30 LAB — CYTOLOGY - PAP
Comment: NEGATIVE
Diagnosis: NEGATIVE
High risk HPV: NEGATIVE

## 2019-04-06 ENCOUNTER — Ambulatory Visit: Payer: Medicaid Other

## 2019-04-08 ENCOUNTER — Ambulatory Visit: Payer: Medicaid Other

## 2019-04-15 ENCOUNTER — Other Ambulatory Visit: Payer: Self-pay

## 2019-04-15 ENCOUNTER — Ambulatory Visit (INDEPENDENT_AMBULATORY_CARE_PROVIDER_SITE_OTHER): Payer: Medicaid Other

## 2019-04-15 ENCOUNTER — Other Ambulatory Visit (HOSPITAL_COMMUNITY)
Admission: RE | Admit: 2019-04-15 | Discharge: 2019-04-15 | Disposition: A | Discharge status: Home / Self Care | Payer: Medicaid Other | Source: Ambulatory Visit | Attending: Obstetrics | Admitting: Obstetrics

## 2019-04-15 DIAGNOSIS — Z113 Encounter for screening for infections with a predominantly sexual mode of transmission: Secondary | ICD-10-CM | POA: Diagnosis not present

## 2019-04-15 DIAGNOSIS — N946 Dysmenorrhea, unspecified: Secondary | ICD-10-CM

## 2019-04-15 NOTE — Progress Notes (Signed)
Pt is in office for self swab due to previous results +Chlamydia. Pt does not think that she contracted and STD.  Pt states she did not take medication that was previously prescribed for +CH.  Pt will await results of today's swab and notified she will be called with any abnormal results.  Self swab with full panel ordered today.

## 2019-04-16 LAB — CERVICOVAGINAL ANCILLARY ONLY
Bacterial Vaginitis (gardnerella): NEGATIVE
Candida Glabrata: NEGATIVE
Candida Vaginitis: NEGATIVE
Chlamydia: NEGATIVE
Comment: NEGATIVE
Comment: NEGATIVE
Comment: NEGATIVE
Comment: NEGATIVE
Comment: NEGATIVE
Comment: NORMAL
Neisseria Gonorrhea: NEGATIVE
Trichomonas: NEGATIVE

## 2019-04-22 ENCOUNTER — Telehealth: Payer: Self-pay | Admitting: Obstetrics

## 2019-04-22 NOTE — Telephone Encounter (Signed)
Returned call to patient regarding her lab results.  Patient had positive chlamydia result on 03/18/19.  Patient was confident this result was incorrect.  Patient returned on 04/15/19 for repeat screening.  This repeat test was negative.  Patient did not take her treatment that was sent in following her 03/18/19 result.   Patient requested the Health Department be notified of her positive result being false.   Will notify [facility] Department.

## 2020-05-11 ENCOUNTER — Other Ambulatory Visit: Payer: Self-pay | Admitting: Obstetrics

## 2020-05-11 DIAGNOSIS — L309 Dermatitis, unspecified: Secondary | ICD-10-CM

## 2020-05-24 ENCOUNTER — Ambulatory Visit (INDEPENDENT_AMBULATORY_CARE_PROVIDER_SITE_OTHER): Payer: Medicaid Other | Admitting: Obstetrics

## 2020-05-24 ENCOUNTER — Other Ambulatory Visit: Payer: Self-pay

## 2020-05-24 ENCOUNTER — Other Ambulatory Visit (HOSPITAL_COMMUNITY)
Admission: RE | Admit: 2020-05-24 | Discharge: 2020-05-24 | Disposition: A | Discharge status: Home / Self Care | Payer: Medicaid Other | Source: Ambulatory Visit | Attending: Obstetrics | Admitting: Obstetrics

## 2020-05-24 ENCOUNTER — Encounter: Payer: Self-pay | Admitting: Obstetrics

## 2020-05-24 VITALS — BP 128/79 | HR 71 | Ht 62.0 in | Wt 135.5 lb

## 2020-05-24 DIAGNOSIS — N898 Other specified noninflammatory disorders of vagina: Secondary | ICD-10-CM

## 2020-05-24 DIAGNOSIS — Z113 Encounter for screening for infections with a predominantly sexual mode of transmission: Secondary | ICD-10-CM

## 2020-05-24 DIAGNOSIS — B369 Superficial mycosis, unspecified: Secondary | ICD-10-CM

## 2020-05-24 DIAGNOSIS — Z01419 Encounter for gynecological examination (general) (routine) without abnormal findings: Secondary | ICD-10-CM

## 2020-05-24 DIAGNOSIS — Z23 Encounter for immunization: Secondary | ICD-10-CM

## 2020-05-24 DIAGNOSIS — Z1239 Encounter for other screening for malignant neoplasm of breast: Secondary | ICD-10-CM

## 2020-05-24 DIAGNOSIS — Z0000 Encounter for general adult medical examination without abnormal findings: Secondary | ICD-10-CM

## 2020-05-24 DIAGNOSIS — J0100 Acute maxillary sinusitis, unspecified: Secondary | ICD-10-CM

## 2020-05-24 DIAGNOSIS — R52 Pain, unspecified: Secondary | ICD-10-CM

## 2020-05-24 MED ORDER — CLOTRIMAZOLE-BETAMETHASONE 1-0.05 % EX CREA: 1.0000 "application " | TOPICAL_CREAM | Freq: Two times a day (BID) | CUTANEOUS | 1 refills | Status: DC

## 2020-05-24 MED ORDER — VITAFOL-NANO 18-0.6-0.4 MG PO TABS: 1.0000 | ORAL_TABLET | Freq: Every day | ORAL | 4 refills | Status: DC

## 2020-05-24 MED ORDER — IBUPROFEN 800 MG PO TABS: 800.0000 mg | ORAL_TABLET | Freq: Three times a day (TID) | ORAL | 5 refills | Status: DC | PRN

## 2020-05-24 MED ORDER — AZITHROMYCIN 250 MG PO TABS: ORAL_TABLET | ORAL | 3 refills | Status: DC

## 2020-05-24 NOTE — Progress Notes (Signed)
Patient presents for AEX. Patient does want STD testing. She declines having any issues.    Last pap 03/18/2019 Normal

## 2020-05-24 NOTE — Addendum Note (Signed)
Addended by: [name] on: 05/24/2020 04:37 PM   Modules accepted: Orders

## 2020-05-24 NOTE — Addendum Note (Signed)
Addended by: Lucianne Lei on: 05/24/2020 04:19 PM   Modules accepted: Orders

## 2020-05-24 NOTE — Progress Notes (Addendum)
Subjective:        [name] is a 39 y.o. female here for a routine exam.  Current complaints: Vaginal discharge.  .    Personal health questionnaire:  Is patient [name], have a family history of breast and/or ovarian cancer: no Is there a family history of uterine cancer diagnosed at age < 39, gastrointestinal cancer, urinary tract cancer, family member who is a Field seismologist syndrome-associated carrier: no Is the patient overweight and hypertensive, family history of diabetes, personal history of gestational diabetes, preeclampsia or PCOS: no Is patient over 47, have PCOS,  family history of premature CHD under age 64, diabetes, smoke, have hypertension or peripheral artery disease:  no At any time, has a partner hit, kicked or otherwise hurt or frightened you?: no Over the past 2 weeks, have you felt down, depressed or hopeless?: no Over the past 2 weeks, have you felt little interest or pleasure in doing things?:no   Gynecologic History Patient's last menstrual period was 05/12/2020. Contraception: none Last Pap: 03-18-2019. Results were: normal Last mammogram: none. Results were: none  Obstetric History OB History  Gravida Para Term Preterm AB Living  3 3 2 1   2   SAB IAB Ectopic Multiple Live Births        0 2    # Outcome Date GA Lbr Len/2nd Weight Sex Delivery Anes PTL Lv  3 Term 02/02/15 [redacted]w[redacted]d 05:19 / 00:50 6 lb 11.6 oz (3.05 kg) F Vag-Spont None  LIV  2 Preterm 02/23/14 [redacted]w[redacted]d  11.4 oz (0.323 kg) F Vag-Spont None  FD  1 Term 06/27/06 [redacted]w[redacted]d  6 lb (2.722 kg) F Vag-Spont None N LIV    Past Medical History:  Diagnosis Date  . Medical history non-contributory     Past Surgical History:  Procedure Laterality Date  . NO PAST SURGERIES    . VAGINAL DELIVERY  2008   epsiotomy with delivery     Current Outpatient Medications:  .  ibuprofen (ADVIL) 800 MG tablet, Take 1 tablet (800 mg total) by mouth every 8 (eight) hours as needed for headache., Disp:  90 tablet, Rfl: 5 .  azithromycin (ZITHROMAX Z-PAK) 250 MG tablet, Take as directed., Disp: 6 tablet, Rfl: 3 .  clotrimazole-betamethasone (LOTRISONE) cream, Apply 1 application topically 2 (two) times daily., Disp: 45 g, Rfl: 1 .  ibuprofen (ADVIL) 800 MG tablet, Take 1 tablet (800 mg total) by mouth every 8 (eight) hours as needed., Disp: 30 tablet, Rfl: 5 .  Prenatal-Fe Fum-Methf-FA w/o A (VITAFOL-NANO) 18-0.6-0.4 MG TABS, Take 1 tablet by mouth daily., Disp: 90 tablet, Rfl: 4 No current facility-administered medications for this visit.  Facility-Administered Medications Ordered in Other Visits:  .  nitroGLYCERIN for uterine relaxation 200 mcg/mL, , , Anesthesia Intra-op, Brock Ra, CRNA, 600 mcg at 02/03/15 0003 No Known Allergies  Social History   Tobacco Use  . Smoking status: Never Smoker  . Smokeless tobacco: Never Used  Substance Use Topics  . Alcohol use: No    Alcohol/week: 0.0 standard drinks    History reviewed. No pertinent family history.    Review of Systems  Constitutional: negative for fatigue and weight loss Respiratory: negative for cough and wheezing Cardiovascular: negative for chest pain, fatigue and palpitations Gastrointestinal: negative for abdominal pain and change in bowel habits Musculoskeletal:negative for myalgias Neurological: negative for gait problems and tremors Behavioral/Psych: negative for abusive relationship, depression Endocrine: negative for temperature intolerance    Genitourinary:negative for abnormal menstrual periods, genital lesions,  hot flashes, sexual problems.  Positive for vaginal discharge Integument/breast: negative for breast lump, breast tenderness, nipple discharge and skin lesion(s)    Objective:       BP 128/79   Pulse 71   Ht 5\' 2"  (1.575 m)   Wt 135 lb 8 oz (61.5 kg)   LMP 05/12/2020   BMI 24.78 kg/m  General:   alert and no distress  Skin:   no rash or abnormalities  Lungs:   clear to auscultation  bilaterally  Heart:   regular rate and rhythm, S1, S2 normal, no murmur, click, rub or gallop  Breasts:   normal without suspicious masses, skin or nipple changes or axillary nodes  Abdomen:  normal findings: no organomegaly, soft, non-tender and no hernia  Pelvis:  External genitalia: normal general appearance Urinary system: urethral meatus normal and bladder without fullness, nontender Vaginal: normal without tenderness, induration or masses Cervix: normal appearance Adnexa: normal bimanual exam Uterus: anteverted and non-tender, normal size   Lab Review Urine pregnancy test Labs reviewed yes Radiologic studies reviewed no  50% of 20 min visit spent on counseling and coordination of care.   Assessment:     1. Encounter for gynecological examination with Papanicolaou smear of cervix Rx: - Cytology - PAP( [place]) - Cholesterol, Total - Comprehensive metabolic panel  2. Vaginal discharge Rx: - Cervicovaginal ancillary only( [place])  3. Screening for STD (sexually transmitted disease) Rx: - Hepatitis C antibody - Hepatitis B surface antigen - HIV Antibody (routine testing w rflx) - RPR  4. Superficial fungus infection of skin Rx: - clotrimazole-betamethasone (LOTRISONE) cream; Apply 1 application topically 2 (two) times daily.  Dispense: 45 g; Refill: 1  5. Pain Rx: - ibuprofen (ADVIL) 800 MG tablet; Take 1 tablet (800 mg total) by mouth every 8 (eight) hours as needed.  Dispense: 30 tablet; Refill: 5  6. Acute maxillary sinusitis, recurrence not specified Rx: - azithromycin (ZITHROMAX Z-PAK) 250 MG tablet; Take as directed.  Dispense: 6 tablet; Refill: 3  7. Screening breast examination Rx: - MM Digital Screening; Future  8. Routine adult health maintenance Rx: - Prenatal-Fe Fum-Methf-FA w/o A (VITAFOL-NANO) 18-0.6-0.4 MG TABS; Take 1 tablet by mouth daily.  Dispense: 90 tablet; Refill: 4  9. Encounter for immunization Rx: - HPV 9-valent  vaccine,Recombinat - Flu Vaccine QUAD 36+ mos IM (Fluarix, Quad PF)   Plan:    Education reviewed: calcium supplements, depression evaluation, low fat, low cholesterol diet, safe sex/STD prevention, self breast exams and weight bearing exercise. Contraception: none. Mammogram ordered. Follow up in: 1 year.   Meds ordered this encounter  Medications  . ibuprofen (ADVIL) 800 MG tablet    Sig: Take 1 tablet (800 mg total) by mouth every 8 (eight) hours as needed.    Dispense:  30 tablet    Refill:  5  . azithromycin (ZITHROMAX Z-PAK) 250 MG tablet    Sig: Take as directed.    Dispense:  6 tablet    Refill:  3  . clotrimazole-betamethasone (LOTRISONE) cream    Sig: Apply 1 application topically 2 (two) times daily.    Dispense:  45 g    Refill:  1  . Prenatal-Fe Fum-Methf-FA w/o A (VITAFOL-NANO) 18-0.6-0.4 MG TABS    Sig: Take 1 tablet by mouth daily.    Dispense:  90 tablet    Refill:  4   Orders Placed This Encounter  Procedures  . MM Digital Screening    Standing Status:   Future  Standing Expiration Date:   05/24/2021    Order Specific Question:   Reason for Exam (SYMPTOM  OR DIAGNOSIS REQUIRED)    Answer:   Screening    Order Specific Question:   Is the patient pregnant?    Answer:   No    Order Specific Question:   Preferred imaging location?    Answer:   [facility]  . HPV 9-valent vaccine,Recombinat  . Flu Vaccine QUAD 36+ mos IM (Fluarix, Quad PF)  . Cholesterol, Total  . Comprehensive metabolic panel  . Hepatitis C antibody  . Hepatitis B surface antigen  . HIV Antibody (routine testing w rflx)  . RPR    [name], MD 05/24/2020 4:34 PM

## 2020-05-24 NOTE — Addendum Note (Signed)
Addended by: Lucianne Lei on: 05/24/2020 03:51 PM   Modules accepted: Orders

## 2020-05-25 LAB — CERVICOVAGINAL ANCILLARY ONLY
Bacterial Vaginitis (gardnerella): NEGATIVE
Candida Glabrata: NEGATIVE
Candida Vaginitis: NEGATIVE
Chlamydia: NEGATIVE
Comment: NEGATIVE
Comment: NEGATIVE
Comment: NEGATIVE
Comment: NEGATIVE
Comment: NEGATIVE
Comment: NORMAL
Neisseria Gonorrhea: NEGATIVE
Trichomonas: NEGATIVE

## 2020-05-25 LAB — COMPREHENSIVE METABOLIC PANEL
ALT: 11 IU/L (ref 0–32)
AST: 16 IU/L (ref 0–40)
Albumin/Globulin Ratio: 1.5 (ref 1.2–2.2)
Albumin: 4.5 g/dL (ref 3.8–4.8)
Alkaline Phosphatase: 44 IU/L (ref 44–121)
BUN/Creatinine Ratio: 13 (ref 9–23)
BUN: 9 mg/dL (ref 6–24)
Bilirubin Total: 0.7 mg/dL (ref 0.0–1.2)
CO2: 25 mmol/L (ref 20–29)
Calcium: 9.4 mg/dL (ref 8.7–10.2)
Chloride: 102 mmol/L (ref 96–106)
Creatinine, Ser: 0.72 mg/dL (ref 0.57–1.00)
GFR calc Af Amer: 121 mL/min/{1.73_m2} (ref >59)
GFR calc non Af Amer: 105 mL/min/{1.73_m2} (ref >59)
Globulin, Total: 3.1 g/dL (ref 1.5–4.5)
Glucose: 78 mg/dL (ref 65–99)
Potassium: 3.8 mmol/L (ref 3.5–5.2)
Sodium: 139 mmol/L (ref 134–144)
Total Protein: 7.6 g/dL (ref 6.0–8.5)

## 2020-05-25 LAB — CHOLESTEROL, TOTAL: Cholesterol, Total: 186 mg/dL (ref 100–199)

## 2020-05-26 LAB — CYTOLOGY - PAP
Comment: NEGATIVE
Diagnosis: NEGATIVE
High risk HPV: NEGATIVE

## 2020-06-07 ENCOUNTER — Other Ambulatory Visit: Payer: Self-pay | Admitting: Obstetrics

## 2020-06-07 DIAGNOSIS — E559 Vitamin D deficiency, unspecified: Secondary | ICD-10-CM

## 2020-06-07 MED ORDER — VITAMIN D (ERGOCALCIFEROL) 1.25 MG (50000 UNIT) PO CAPS: 50000.0000 [IU] | ORAL_CAPSULE | ORAL | 0 refills | Status: DC

## 2020-06-07 MED ORDER — VITAMIN D 50 MCG (2000 UT) PO CAPS: 1.0000 | ORAL_CAPSULE | Freq: Every day | ORAL | 11 refills | Status: AC

## 2020-06-09 ENCOUNTER — Other Ambulatory Visit: Payer: Self-pay | Admitting: Obstetrics

## 2020-06-09 DIAGNOSIS — E559 Vitamin D deficiency, unspecified: Secondary | ICD-10-CM

## 2020-06-09 MED ORDER — VITAMIN D 50 MCG (2000 UT) PO CAPS: 1.0000 | ORAL_CAPSULE | Freq: Every day | ORAL | 11 refills | Status: AC

## 2020-06-12 ENCOUNTER — Other Ambulatory Visit: Payer: Self-pay | Admitting: Obstetrics

## 2020-06-12 DIAGNOSIS — R059 Cough, unspecified: Secondary | ICD-10-CM

## 2020-06-12 MED ORDER — BENZONATATE 100 MG PO CAPS: 100.0000 mg | ORAL_CAPSULE | Freq: Three times a day (TID) | ORAL | 2 refills | Status: DC

## 2020-12-06 ENCOUNTER — Other Ambulatory Visit: Payer: Self-pay

## 2020-12-06 ENCOUNTER — Ambulatory Visit: Payer: Medicaid Other

## 2020-12-06 ENCOUNTER — Ambulatory Visit
Admission: RE | Admit: 2020-12-06 | Discharge: 2020-12-06 | Disposition: A | Discharge status: Home / Self Care | Payer: Medicaid Other | Source: Ambulatory Visit | Attending: Obstetrics | Admitting: Obstetrics

## 2020-12-06 DIAGNOSIS — Z1239 Encounter for other screening for malignant neoplasm of breast: Secondary | ICD-10-CM

## 2021-01-23 ENCOUNTER — Telehealth: Payer: Self-pay | Admitting: Obstetrics

## 2021-01-23 DIAGNOSIS — Z0000 Encounter for general adult medical examination without abnormal findings: Secondary | ICD-10-CM

## 2021-01-23 MED ORDER — VITAFOL-NANO 18-0.6-0.4 MG PO TABS: 1.0000 | ORAL_TABLET | Freq: Every day | ORAL | 4 refills | Status: DC

## 2021-01-23 NOTE — Telephone Encounter (Signed)
Patient called requesting refill on her prenatal vitamins.    According to pharmacy a prior Josem Kaufmann is now required.  Requested by phone that prior auth be faxed over.   Resent prenatal Rx to pharmacy.

## 2021-01-30 ENCOUNTER — Other Ambulatory Visit: Payer: Self-pay | Admitting: Obstetrics

## 2021-01-30 MED ORDER — PRENATE MINI 18-0.6-0.4-350 MG PO CAPS: 1.0000 | ORAL_CAPSULE | Freq: Every day | ORAL | 12 refills | Status: AC

## 2021-04-06 ENCOUNTER — Other Ambulatory Visit: Payer: Self-pay | Admitting: *Deleted

## 2021-04-06 DIAGNOSIS — Z01419 Encounter for gynecological examination (general) (routine) without abnormal findings: Secondary | ICD-10-CM

## 2021-04-06 MED ORDER — VITAFOL GUMMIES 3.33-0.333-34.8 MG PO CHEW: 1.0000 | CHEWABLE_TABLET | Freq: Every day | ORAL | 5 refills | Status: DC

## 2021-05-25 ENCOUNTER — Ambulatory Visit (INDEPENDENT_AMBULATORY_CARE_PROVIDER_SITE_OTHER): Payer: Medicaid Other | Admitting: Obstetrics & Gynecology

## 2021-05-25 ENCOUNTER — Encounter: Payer: Self-pay | Admitting: Obstetrics & Gynecology

## 2021-05-25 ENCOUNTER — Other Ambulatory Visit (HOSPITAL_COMMUNITY)
Admission: RE | Admit: 2021-05-25 | Discharge: 2021-05-25 | Disposition: A | Discharge status: Home / Self Care | Payer: Medicaid Other | Source: Ambulatory Visit | Attending: Obstetrics & Gynecology | Admitting: Obstetrics & Gynecology

## 2021-05-25 ENCOUNTER — Other Ambulatory Visit: Payer: Self-pay

## 2021-05-25 VITALS — BP 127/84 | HR 80 | Wt 145.0 lb

## 2021-05-25 DIAGNOSIS — Z01419 Encounter for gynecological examination (general) (routine) without abnormal findings: Secondary | ICD-10-CM | POA: Diagnosis present

## 2021-05-25 DIAGNOSIS — E559 Vitamin D deficiency, unspecified: Secondary | ICD-10-CM | POA: Diagnosis not present

## 2021-05-25 NOTE — Progress Notes (Signed)
Patient ID: Tashona Calk, female   DOB: Jul 15, 1980, 40 y.o.   MRN: [id number]  Cc: annual exam   HPI Treonna Klee is a 40 y.o. female.  [id number] Patient's last menstrual period was 05/04/2021. Uses no BCM currently and not sexually active, requests STD screening and pap. She has had annual paps for several years with no abnormality.  HPI  Past Medical History:  Diagnosis Date   Medical history non-contributory     Past Surgical History:  Procedure Laterality Date   NO PAST SURGERIES     VAGINAL DELIVERY  2008   epsiotomy with delivery    History reviewed. No pertinent family history.  Social History Social History   Tobacco Use   Smoking status: Never   Smokeless tobacco: Never  Vaping Use   Vaping Use: Never used  Substance Use Topics   Alcohol use: No    Alcohol/week: 0.0 standard drinks   Drug use: No    No Known Allergies  Current Outpatient Medications  Medication Sig Dispense Refill   Prenatal Vit-Fe Phos-FA-Omega (VITAFOL GUMMIES) 3.33-0.333-34.8 MG CHEW Chew 1 tablet by mouth daily. 90 tablet 5   azithromycin (ZITHROMAX Z-PAK) 250 MG tablet Take as directed. (Patient not taking: Reported on 05/25/2021) 6 tablet 3   benzonatate (TESSALON PERLES) 100 MG capsule Take 1 capsule (100 mg total) by mouth 3 (three) times daily. (Patient not taking: Reported on 05/25/2021) 30 capsule 2   Cholecalciferol (VITAMIN D) 50 MCG (2000 UT) CAPS Take 1 capsule (2,000 Units total) by mouth daily before breakfast. Start taking after finishing Rx 50,000 IU"s (Patient not taking: Reported on 05/25/2021) 30 capsule 11   Cholecalciferol (VITAMIN D) 50 MCG (2000 UT) CAPS Take 1 capsule (2,000 Units total) by mouth daily before breakfast. (Patient not taking: Reported on 05/25/2021) 30 capsule 11   clotrimazole-betamethasone (LOTRISONE) cream Apply 1 application topically 2 (two) times daily. (Patient not taking: Reported on 05/25/2021) 45 g 1   ibuprofen (ADVIL)  800 MG tablet Take 1 tablet (800 mg total) by mouth every 8 (eight) hours as needed for headache. (Patient not taking: Reported on 05/25/2021) 90 tablet 5   ibuprofen (ADVIL) 800 MG tablet Take 1 tablet (800 mg total) by mouth every 8 (eight) hours as needed. (Patient not taking: Reported on 05/25/2021) 30 tablet 5   Prenatal-Fe Fum-Methf-FA w/o A (VITAFOL-NANO) 18-0.6-0.4 MG TABS Take 1 tablet by mouth daily. (Patient not taking: Reported on 05/25/2021) 90 tablet 4   Vitamin D, Ergocalciferol, (DRISDOL) 1.25 MG (50000 UNIT) CAPS capsule Take 1 capsule (50,000 Units total) by mouth every 7 (seven) days. (Patient not taking: Reported on 05/25/2021) 5 capsule 0   No current facility-administered medications for this visit.   Facility-Administered Medications Ordered in Other Visits  Medication Dose Route Frequency Provider Last Rate Last Admin   nitroGLYCERIN for uterine relaxation 200 mcg/mL    Anesthesia Intra-op Brock Ra, CRNA   600 mcg at 02/03/15 0003    Review of Systems Review of Systems  Constitutional: Negative.   Respiratory: Negative.    Genitourinary:  Negative for menstrual problem and pelvic pain.   Blood pressure 127/84, weight 145 lb (65.8 kg), last menstrual period 05/04/2021.  Physical Exam Physical Exam Vitals and nursing note reviewed. Exam conducted with a chaperone present.  Constitutional:      Appearance: Normal appearance.  HENT:     Head: Normocephalic.  Cardiovascular:     Rate and Rhythm: Normal rate.  Pulmonary:     Effort:  Pulmonary effort is normal.  Chest:  Breasts:    Right: Normal.     Left: Normal.  Abdominal:     General: Abdomen is flat.     Palpations: Abdomen is soft.  Genitourinary:    General: Normal vulva.     Exam position: Lithotomy position.     Vagina: Normal.     Cervix: Normal.     Uterus: Normal.      Adnexa: Right adnexa normal and left adnexa normal.  Lymphadenopathy:     Upper Body:     Right upper body: No  axillary adenopathy.     Left upper body: No axillary adenopathy.  Skin:    General: Skin is warm and dry.  Neurological:     General: No focal deficit present.     Mental Status: She is alert and oriented to person, place, and time.  Psychiatric:        Mood and Affect: Mood normal.        Behavior: Behavior normal.    Data Reviewed Pap tests X 3 Office note 2021  Assessment Vitamin D deficiency  Well woman exam with routine gynecological exam - Plan: Cervicovaginal ancillary only( [place]), Hepatitis B surface antigen, Hepatitis C antibody, HIV Antibody (routine testing w rflx), RPR, Cholesterol, Total, Basic metabolic panel, Vitamin D 1,25 dihydroxy, Cytology - PAP( [place]), CBC Requests pap with understanding that recommendation is repeat Q3-5 years not annually  Plan F/u on today's labs  Mammography as indicated, normal exam 11/2020    Emeterio Reeve 05/25/2021, 11:54 AM

## 2021-05-25 NOTE — Progress Notes (Signed)
Pt presents for annual and all STD screening.  Pt reports no gyn issues today.   PHQ9= 0 GAD7=0

## 2021-05-26 LAB — HEPATITIS B SURFACE ANTIGEN: Hepatitis B Surface Ag: NEGATIVE

## 2021-05-26 LAB — HIV ANTIBODY (ROUTINE TESTING W REFLEX): HIV Screen 4th Generation wRfx: NONREACTIVE

## 2021-05-26 LAB — RPR: RPR Ser Ql: NONREACTIVE

## 2021-05-26 LAB — HEPATITIS C ANTIBODY: Hep C Virus Ab: <0.1 s/co ratio (ref 0.0–0.9)

## 2021-05-28 LAB — CERVICOVAGINAL ANCILLARY ONLY
Bacterial Vaginitis (gardnerella): NEGATIVE
Candida Glabrata: NEGATIVE
Candida Vaginitis: NEGATIVE
Chlamydia: NEGATIVE
Comment: NEGATIVE
Comment: NEGATIVE
Comment: NEGATIVE
Comment: NEGATIVE
Comment: NEGATIVE
Comment: NORMAL
Neisseria Gonorrhea: NEGATIVE
Trichomonas: NEGATIVE

## 2021-05-30 LAB — CYTOLOGY - PAP
Comment: NEGATIVE
Diagnosis: NEGATIVE
High risk HPV: NEGATIVE

## 2021-06-06 LAB — CBC
Hematocrit: 39.9 % (ref 34.0–46.6)
Hemoglobin: 13.3 g/dL (ref 11.1–15.9)
MCH: 29 pg (ref 26.6–33.0)
MCHC: 33.3 g/dL (ref 31.5–35.7)
MCV: 87 fL (ref 79–97)
Platelets: 235 10*3/uL (ref 150–450)
RBC: 4.58 x10E6/uL (ref 3.77–5.28)
RDW: 12.6 % (ref 11.7–15.4)
WBC: 5.4 10*3/uL (ref 3.4–10.8)

## 2021-06-06 LAB — VITAMIN D 1,25 DIHYDROXY
Vitamin D 1, 25 (OH)2 Total: 71 pg/mL — ABNORMAL HIGH
Vitamin D2 1, 25 (OH)2: <10 pg/mL
Vitamin D3 1, 25 (OH)2: 71 pg/mL

## 2021-06-06 LAB — BASIC METABOLIC PANEL
BUN/Creatinine Ratio: 13 (ref 9–23)
BUN: 8 mg/dL (ref 6–24)
CO2: 23 mmol/L (ref 20–29)
Calcium: 9.7 mg/dL (ref 8.7–10.2)
Chloride: 99 mmol/L (ref 96–106)
Creatinine, Ser: 0.64 mg/dL (ref 0.57–1.00)
Glucose: 80 mg/dL (ref 70–99)
Potassium: 3.7 mmol/L (ref 3.5–5.2)
Sodium: 136 mmol/L (ref 134–144)
eGFR: 114 mL/min/{1.73_m2} (ref >59)

## 2021-06-06 LAB — CHOLESTEROL, TOTAL: Cholesterol, Total: 191 mg/dL (ref 100–199)

## 2021-06-13 ENCOUNTER — Encounter: Payer: Self-pay | Admitting: Obstetrics & Gynecology

## 2022-01-14 ENCOUNTER — Encounter: Payer: Self-pay | Admitting: Obstetrics

## 2022-02-25 ENCOUNTER — Other Ambulatory Visit: Payer: Self-pay | Admitting: Obstetrics

## 2022-02-25 DIAGNOSIS — R52 Pain, unspecified: Secondary | ICD-10-CM

## 2022-02-25 DIAGNOSIS — N946 Dysmenorrhea, unspecified: Secondary | ICD-10-CM

## 2022-02-28 ENCOUNTER — Telehealth: Payer: Self-pay | Admitting: Obstetrics

## 2022-02-28 ENCOUNTER — Other Ambulatory Visit: Payer: Self-pay | Admitting: Obstetrics

## 2022-02-28 DIAGNOSIS — N946 Dysmenorrhea, unspecified: Secondary | ICD-10-CM

## 2022-02-28 MED ORDER — IBUPROFEN 800 MG PO TABS: 800.0000 mg | ORAL_TABLET | Freq: Three times a day (TID) | ORAL | 5 refills | Status: DC | PRN

## 2022-02-28 NOTE — Telephone Encounter (Signed)
Patient called requesting a refill on her Ibuprofen '800mg'$  prescription.  She is requesting enough refills for a year.  She uses [facility] on [address] for her pharmacy.   Routing to provider or authorization.

## 2022-03-19 ENCOUNTER — Encounter: Payer: Self-pay | Admitting: Obstetrics

## 2022-03-28 ENCOUNTER — Encounter: Payer: Self-pay | Admitting: Obstetrics

## 2022-05-28 ENCOUNTER — Ambulatory Visit (INDEPENDENT_AMBULATORY_CARE_PROVIDER_SITE_OTHER): Payer: Medicaid Other | Admitting: Obstetrics

## 2022-05-28 ENCOUNTER — Encounter: Payer: Self-pay | Admitting: Obstetrics

## 2022-05-28 ENCOUNTER — Other Ambulatory Visit (HOSPITAL_COMMUNITY)
Admission: RE | Admit: 2022-05-28 | Discharge: 2022-05-28 | Disposition: A | Discharge status: Home / Self Care | Payer: Medicaid Other | Source: Ambulatory Visit | Attending: Obstetrics | Admitting: Obstetrics

## 2022-05-28 VITALS — BP 131/89 | HR 85 | Ht 62.5 in | Wt 139.0 lb

## 2022-05-28 DIAGNOSIS — L0292 Furuncle, unspecified: Secondary | ICD-10-CM

## 2022-05-28 DIAGNOSIS — J028 Acute pharyngitis due to other specified organisms: Secondary | ICD-10-CM

## 2022-05-28 DIAGNOSIS — N946 Dysmenorrhea, unspecified: Secondary | ICD-10-CM

## 2022-05-28 DIAGNOSIS — N898 Other specified noninflammatory disorders of vagina: Secondary | ICD-10-CM | POA: Insufficient documentation

## 2022-05-28 DIAGNOSIS — Z01419 Encounter for gynecological examination (general) (routine) without abnormal findings: Secondary | ICD-10-CM | POA: Diagnosis present

## 2022-05-28 DIAGNOSIS — Z0000 Encounter for general adult medical examination without abnormal findings: Secondary | ICD-10-CM

## 2022-05-28 DIAGNOSIS — Z113 Encounter for screening for infections with a predominantly sexual mode of transmission: Secondary | ICD-10-CM

## 2022-05-28 DIAGNOSIS — R110 Nausea: Secondary | ICD-10-CM

## 2022-05-28 DIAGNOSIS — Z1239 Encounter for other screening for malignant neoplasm of breast: Secondary | ICD-10-CM

## 2022-05-28 DIAGNOSIS — B379 Candidiasis, unspecified: Secondary | ICD-10-CM

## 2022-05-28 MED ORDER — FLUCONAZOLE 150 MG PO TABS: 150.0000 mg | ORAL_TABLET | Freq: Once | ORAL | 0 refills | Status: AC

## 2022-05-28 MED ORDER — PNV-DHA+DOCUSATE 27-1.25-300 MG PO CAPS: 1.0000 | ORAL_CAPSULE | Freq: Every day | ORAL | 4 refills | Status: DC

## 2022-05-28 MED ORDER — DOXYCYCLINE HYCLATE 100 MG PO CAPS: 100.0000 mg | ORAL_CAPSULE | Freq: Two times a day (BID) | ORAL | 2 refills | Status: DC

## 2022-05-28 MED ORDER — PROMETHAZINE HCL 25 MG PO TABS: 25.0000 mg | ORAL_TABLET | Freq: Four times a day (QID) | ORAL | 2 refills | Status: DC | PRN

## 2022-05-28 MED ORDER — IBUPROFEN 800 MG PO TABS: 800.0000 mg | ORAL_TABLET | Freq: Three times a day (TID) | ORAL | 5 refills | Status: DC | PRN

## 2022-05-28 MED ORDER — AZITHROMYCIN 250 MG PO TABS: 250.0000 mg | ORAL_TABLET | Freq: Every day | ORAL | 3 refills | Status: DC

## 2022-05-28 NOTE — Progress Notes (Signed)
41 y.o GYN presents for AEX/PAP/STD screening.

## 2022-05-28 NOTE — Progress Notes (Addendum)
Subjective:        [name] is a 42 y.o. female here for a routine exam.  Current complaints: Vaginal discharge .    Personal health questionnaire:  Is patient [name], have a family history of breast and/or ovarian cancer: No Is there a family history of uterine cancer diagnosed at age < 90, gastrointestinal cancer, urinary tract cancer, family member who is a Field seismologist syndrome-associated carrier:No  Is the patient overweight and hypertensive, family history of diabetes, personal history of gestational diabetes, preeclampsia or PCOS: no Is patient over 40, have PCOS,  family history of premature CHD under age 50, diabetes, smoke, have hypertension or peripheral artery disease:  no At any time, has a partner hit, kicked or otherwise hurt or frightened you?: no Over the past 2 weeks, have you felt down, depressed or hopeless?: no Over the past 2 weeks, have you felt little interest or pleasure in doing things?:no   Gynecologic History Patient's last menstrual period was 05/12/2022 (exact date). Contraception: none Last Pap: 2022. Results were: normal Last mammogram: 2023. Results were: normal  Obstetric History OB History  Gravida Para Term Preterm AB Living  '3 3 2 1   2  '$ SAB IAB Ectopic Multiple Live Births        0 2    # Outcome Date GA Lbr Len/2nd Weight Sex Delivery Anes PTL Lv  3 Term 02/02/15 29w1d05:19 / 00:50 6 lb 11.6 oz (3.05 kg) F Vag-Spont None  LIV  2 Preterm 02/23/14 23w0d11.4 oz (0.323 kg) F Vag-Spont None  FD  1 Term 06/27/06 3950w5d lb (2.722 kg) F Vag-Spont None N LIV    Past Medical History:  Diagnosis Date   Medical history non-contributory     Past Surgical History:  Procedure Laterality Date   NO PAST SURGERIES     VAGINAL DELIVERY  2008   epsiotomy with delivery     Current Outpatient Medications:    azithromycin (ZITHROMAX) 250 MG tablet, Take 1 tablet (250 mg total) by mouth daily., Disp: 14 tablet, Rfl: 3    doxycycline (VIBRAMYCIN) 100 MG capsule, Take 1 capsule (100 mg total) by mouth 2 (two) times daily., Disp: 14 capsule, Rfl: 2   fluconazole (DIFLUCAN) 150 MG tablet, Take 1 tablet (150 mg total) by mouth once for 1 dose., Disp: 1 tablet, Rfl: 0   ibuprofen (ADVIL) 800 MG tablet, Take 1 tablet (800 mg total) by mouth every 8 (eight) hours as needed., Disp: 30 tablet, Rfl: 5   Prenat-FeFum-DSS-FA-DHA w/o A (PNV-DHA+DOCUSATE) 27-1.25-300 MG CAPS, Take 1 capsule by mouth daily before breakfast., Disp: 90 capsule, Rfl: 4   promethazine (PHENERGAN) 25 MG tablet, Take 1 tablet (25 mg total) by mouth every 6 (six) hours as needed for nausea or vomiting., Disp: 30 tablet, Rfl: 2   azithromycin (ZITHROMAX Z-PAK) 250 MG tablet, Take as directed. (Patient not taking: Reported on 05/25/2021), Disp: 6 tablet, Rfl: 3   benzonatate (TESSALON PERLES) 100 MG capsule, Take 1 capsule (100 mg total) by mouth 3 (three) times daily. (Patient not taking: Reported on 05/25/2021), Disp: 30 capsule, Rfl: 2   Cholecalciferol (VITAMIN D) 50 MCG (2000 UT) CAPS, Take 1 capsule (2,000 Units total) by mouth daily before breakfast. Start taking after finishing Rx 50,000 IU"s (Patient not taking: Reported on 05/25/2021), Disp: 30 capsule, Rfl: 11   Cholecalciferol (VITAMIN D) 50 MCG (2000 UT) CAPS, Take 1 capsule (2,000 Units total) by mouth daily before breakfast. (  Patient not taking: Reported on 05/25/2021), Disp: 30 capsule, Rfl: 11   clotrimazole-betamethasone (LOTRISONE) cream, Apply 1 application topically 2 (two) times daily. (Patient not taking: Reported on 05/25/2021), Disp: 45 g, Rfl: 1   ibuprofen (ADVIL) 800 MG tablet, Take 1 tablet (800 mg total) by mouth every 8 (eight) hours as needed for headache., Disp: 60 tablet, Rfl: 5   Prenatal Vit-Fe Phos-FA-Omega (VITAFOL GUMMIES) 3.33-0.333-34.8 MG CHEW, Chew 1 tablet by mouth daily. (Patient not taking: Reported on 05/28/2022), Disp: 90 tablet, Rfl: 5   Prenatal-Fe Fum-Methf-FA  w/o A (VITAFOL-NANO) 18-0.6-0.4 MG TABS, Take 1 tablet by mouth daily. (Patient not taking: Reported on 05/25/2021), Disp: 90 tablet, Rfl: 4   Vitamin D, Ergocalciferol, (DRISDOL) 1.25 MG (50000 UNIT) CAPS capsule, Take 1 capsule (50,000 Units total) by mouth every 7 (seven) days. (Patient not taking: Reported on 05/25/2021), Disp: 5 capsule, Rfl: 0 No current facility-administered medications for this visit.  Facility-Administered Medications Ordered in Other Visits:    nitroGLYCERIN for uterine relaxation 200 mcg/mL, , , Anesthesia Intra-op, Brock Ra, CRNA, 600 mcg at 02/03/15 0003 No Known Allergies  Social History   Tobacco Use   Smoking status: Never   Smokeless tobacco: Never  Substance Use Topics   Alcohol use: No    Alcohol/week: 0.0 standard drinks of alcohol    History reviewed. No pertinent family history.    Review of Systems  Constitutional: negative for fatigue and weight loss Respiratory: negative for cough and wheezing Cardiovascular: negative for chest pain, fatigue and palpitations Gastrointestinal: negative for abdominal pain and change in bowel habits Musculoskeletal:negative for myalgias Neurological: negative for gait problems and tremors Behavioral/Psych: negative for abusive relationship, depression Endocrine: negative for temperature intolerance    Genitourinary: positive for vaginal discharge.  negative for abnormal menstrual periods, genital lesions, hot flashes, sexual problems and vaginal discharge Integument/breast: negative for breast lump, breast tenderness, nipple discharge and skin lesion(s)    Objective:       BP 131/89   Pulse 85   Ht 5' 2.5" (1.588 m)   Wt 139 lb (63 kg)   LMP 05/12/2022 (Exact Date)   BMI 25.02 kg/m  General:   Alert and no distress  Skin:   no rash or abnormalities  Lungs:   clear to auscultation bilaterally  Heart:   regular rate and rhythm, S1, S2 normal, no murmur, click, rub or gallop  Breasts:    normal without suspicious masses, skin or nipple changes or axillary nodes  Abdomen:  normal findings: no organomegaly, soft, non-tender and no hernia  Pelvis:  External genitalia: normal general appearance Urinary system: urethral meatus normal and bladder without fullness, nontender Vaginal: normal without tenderness, induration or masses Cervix: normal appearance Adnexa: normal bimanual exam Uterus: anteverted and non-tender, normal size   Lab Review Urine pregnancy test Labs reviewed yes Radiologic studies reviewed yes  I have spent a total of 20 minutes of face-to-face time, excluding clinical staff time, reviewing notes and preparing to see patient, ordering tests and/or medications, and counseling the patient.   Assessment:    1. Encounter for gynecological examination with Papanicolaou smear of cervix Rx: - Cytology - PAP( [place])  2. Vaginal discharge Rx: - Cervicovaginal ancillary only( [place])  3. Screening for STD (sexually transmitted disease) Rx: - Hepatitis C Antibody - HIV antibody (with reflex) - RPR - Vitamin D (25 hydroxy)  4. Screening breast examination Rx: - MM 3D SCREEN BREAST BILATERAL; Future  5. Routine adult health  maintenance Rx: - CBC - Ferritin - Comprehensive metabolic panel - TSH - Triglycerides - Cholesterol, total - HDL cholesterol - Direct LDL - Prenat-FeFum-DSS-FA-DHA w/o A (PNV-DHA+DOCUSATE) 27-1.25-300 MG CAPS; Take 1 capsule by mouth daily before breakfast.  Dispense: 90 capsule; Refill: 4  6. Dysmenorrhea Rx: - ibuprofen (ADVIL) 800 MG tablet; Take 1 tablet (800 mg total) by mouth every 8 (eight) hours as needed.  Dispense: 30 tablet; Refill: 5  7. Acute pharyngitis due to other specified organisms Rx: - azithromycin (ZITHROMAX) 250 MG tablet; Take 1 tablet (250 mg total) by mouth daily.  Dispense: 14 tablet; Refill: 3  8. Boil Rx: - doxycycline (VIBRAMYCIN) 100 MG capsule; Take 1 capsule (100 mg total) by  mouth 2 (two) times daily.  Dispense: 14 capsule; Refill: 2  9. Nausea Rx: - promethazine (PHENERGAN) 25 MG tablet; Take 1 tablet (25 mg total) by mouth every 6 (six) hours as needed for nausea or vomiting.  Dispense: 30 tablet; Refill: 2  10. Candida albicans infection Rx: - fluconazole (DIFLUCAN) 150 MG tablet; Take 1 tablet (150 mg total) by mouth once for 1 dose.  Dispense: 1 tablet; Refill: 0     Plan:    Education reviewed: calcium supplements, depression evaluation, low fat, low cholesterol diet, safe sex/STD prevention, self breast exams, and weight bearing exercise. Contraception: abstinence. Follow up in: 1 year.   Meds ordered this encounter  Medications   ibuprofen (ADVIL) 800 MG tablet    Sig: Take 1 tablet (800 mg total) by mouth every 8 (eight) hours as needed.    Dispense:  30 tablet    Refill:  5   azithromycin (ZITHROMAX) 250 MG tablet    Sig: Take 1 tablet (250 mg total) by mouth daily.    Dispense:  14 tablet    Refill:  3   promethazine (PHENERGAN) 25 MG tablet    Sig: Take 1 tablet (25 mg total) by mouth every 6 (six) hours as needed for nausea or vomiting.    Dispense:  30 tablet    Refill:  2   doxycycline (VIBRAMYCIN) 100 MG capsule    Sig: Take 1 capsule (100 mg total) by mouth 2 (two) times daily.    Dispense:  14 capsule    Refill:  2   Prenat-FeFum-DSS-FA-DHA w/o A (PNV-DHA+DOCUSATE) 27-1.25-300 MG CAPS    Sig: Take 1 capsule by mouth daily before breakfast.    Dispense:  90 capsule    Refill:  4   fluconazole (DIFLUCAN) 150 MG tablet    Sig: Take 1 tablet (150 mg total) by mouth once for 1 dose.    Dispense:  1 tablet    Refill:  0   Orders Placed This Encounter  Procedures   MM 3D SCREEN BREAST BILATERAL    Standing Status:   Future    Standing Expiration Date:   05/28/2023    Order Specific Question:   Reason for Exam (SYMPTOM  OR DIAGNOSIS REQUIRED)    Answer:   Screening    Order Specific Question:   Is the patient pregnant?     Answer:   No    Order Specific Question:   Preferred imaging location?    Answer:   [facility]   Hepatitis C Antibody   HIV antibody (with reflex)   RPR   Vitamin D (25 hydroxy)   CBC   Ferritin   Comprehensive metabolic panel   TSH   Triglycerides   Cholesterol, total   HDL  cholesterol   Direct LDL    Klayton Monie A. Jodi Mourning MD 05/28/2022

## 2022-05-29 ENCOUNTER — Encounter: Payer: Self-pay | Admitting: Obstetrics

## 2022-05-29 LAB — CBC
Hematocrit: 39.1 % (ref 34.0–46.6)
Hemoglobin: 13.1 g/dL (ref 11.1–15.9)
MCH: 29.2 pg (ref 26.6–33.0)
MCHC: 33.5 g/dL (ref 31.5–35.7)
MCV: 87 fL (ref 79–97)
Platelets: 266 10*3/uL (ref 150–450)
RBC: 4.49 x10E6/uL (ref 3.77–5.28)
RDW: 12.3 % (ref 11.7–15.4)
WBC: 3.4 10*3/uL (ref 3.4–10.8)

## 2022-05-29 LAB — FERRITIN: Ferritin: 42 ng/mL (ref 15–150)

## 2022-05-29 LAB — CERVICOVAGINAL ANCILLARY ONLY
Bacterial Vaginitis (gardnerella): NEGATIVE
Candida Glabrata: NEGATIVE
Candida Vaginitis: NEGATIVE
Chlamydia: NEGATIVE
Comment: NEGATIVE
Comment: NEGATIVE
Comment: NEGATIVE
Comment: NEGATIVE
Comment: NEGATIVE
Comment: NORMAL
Neisseria Gonorrhea: NEGATIVE
Trichomonas: NEGATIVE

## 2022-05-29 LAB — TSH: TSH: 0.874 u[IU]/mL (ref 0.450–4.500)

## 2022-05-29 LAB — RPR: RPR Ser Ql: NONREACTIVE

## 2022-05-29 LAB — HIV ANTIBODY (ROUTINE TESTING W REFLEX): HIV Screen 4th Generation wRfx: NONREACTIVE

## 2022-05-29 LAB — COMPREHENSIVE METABOLIC PANEL
ALT: 11 IU/L (ref 0–32)
AST: 20 IU/L (ref 0–40)
Albumin/Globulin Ratio: 1.6 (ref 1.2–2.2)
Albumin: 4.5 g/dL (ref 3.9–4.9)
Alkaline Phosphatase: 48 IU/L (ref 44–121)
BUN/Creatinine Ratio: 17 (ref 9–23)
BUN: 12 mg/dL (ref 6–24)
Bilirubin Total: 0.3 mg/dL (ref 0.0–1.2)
CO2: 19 mmol/L — ABNORMAL LOW (ref 20–29)
Calcium: 9.3 mg/dL (ref 8.7–10.2)
Chloride: 103 mmol/L (ref 96–106)
Creatinine, Ser: 0.71 mg/dL (ref 0.57–1.00)
Globulin, Total: 2.9 g/dL (ref 1.5–4.5)
Glucose: 94 mg/dL (ref 70–99)
Potassium: 4.2 mmol/L (ref 3.5–5.2)
Sodium: 138 mmol/L (ref 134–144)
Total Protein: 7.4 g/dL (ref 6.0–8.5)
eGFR: 109 mL/min/{1.73_m2} (ref >59)

## 2022-05-29 LAB — CHOLESTEROL, TOTAL: Cholesterol, Total: 179 mg/dL (ref 100–199)

## 2022-05-29 LAB — LDL CHOLESTEROL, DIRECT: LDL Direct: 118 mg/dL — ABNORMAL HIGH (ref 0–99)

## 2022-05-29 LAB — HEPATITIS C ANTIBODY: Hep C Virus Ab: NONREACTIVE

## 2022-05-29 LAB — TRIGLYCERIDES: Triglycerides: 67 mg/dL (ref 0–149)

## 2022-05-29 LAB — HDL CHOLESTEROL: HDL: 53 mg/dL (ref >39)

## 2022-05-29 LAB — VITAMIN D 25 HYDROXY (VIT D DEFICIENCY, FRACTURES): Vit D, 25-Hydroxy: 15.5 ng/mL — ABNORMAL LOW (ref 30.0–100.0)

## 2022-05-30 ENCOUNTER — Encounter: Payer: Self-pay | Admitting: Obstetrics

## 2022-05-30 LAB — CYTOLOGY - PAP
Comment: NEGATIVE
Diagnosis: NEGATIVE
High risk HPV: NEGATIVE

## 2023-01-14 ENCOUNTER — Encounter: Payer: Self-pay | Admitting: Obstetrics

## 2023-06-05 ENCOUNTER — Encounter: Payer: Self-pay | Admitting: Obstetrics

## 2023-06-05 ENCOUNTER — Other Ambulatory Visit (HOSPITAL_COMMUNITY)
Admission: RE | Admit: 2023-06-05 | Discharge: 2023-06-05 | Disposition: A | Discharge status: Home / Self Care | Payer: Medicaid Other | Source: Ambulatory Visit | Attending: Obstetrics | Admitting: Obstetrics

## 2023-06-05 ENCOUNTER — Ambulatory Visit (INDEPENDENT_AMBULATORY_CARE_PROVIDER_SITE_OTHER): Payer: Medicaid Other | Admitting: Obstetrics

## 2023-06-05 VITALS — BP 134/89 | HR 76 | Wt 137.9 lb

## 2023-06-05 DIAGNOSIS — Z113 Encounter for screening for infections with a predominantly sexual mode of transmission: Secondary | ICD-10-CM

## 2023-06-05 DIAGNOSIS — N898 Other specified noninflammatory disorders of vagina: Secondary | ICD-10-CM | POA: Diagnosis not present

## 2023-06-05 DIAGNOSIS — J0100 Acute maxillary sinusitis, unspecified: Secondary | ICD-10-CM

## 2023-06-05 DIAGNOSIS — N946 Dysmenorrhea, unspecified: Secondary | ICD-10-CM

## 2023-06-05 DIAGNOSIS — R300 Dysuria: Secondary | ICD-10-CM

## 2023-06-05 DIAGNOSIS — Z01419 Encounter for gynecological examination (general) (routine) without abnormal findings: Secondary | ICD-10-CM

## 2023-06-05 DIAGNOSIS — L0292 Furuncle, unspecified: Secondary | ICD-10-CM

## 2023-06-05 DIAGNOSIS — D508 Other iron deficiency anemias: Secondary | ICD-10-CM

## 2023-06-05 MED ORDER — FLUCONAZOLE 200 MG PO TABS: 200.0000 mg | ORAL_TABLET | ORAL | 2 refills | Status: DC

## 2023-06-05 MED ORDER — DOXYCYCLINE HYCLATE 100 MG PO CAPS: 100.0000 mg | ORAL_CAPSULE | Freq: Two times a day (BID) | ORAL | 2 refills | Status: DC

## 2023-06-05 MED ORDER — VITAFOL ULTRA 29-0.6-0.4-200 MG PO CAPS: 1.0000 | ORAL_CAPSULE | Freq: Every day | ORAL | 4 refills | Status: DC

## 2023-06-05 MED ORDER — NITROFURANTOIN MONOHYD MACRO 100 MG PO CAPS: 100.0000 mg | ORAL_CAPSULE | Freq: Two times a day (BID) | ORAL | 2 refills | Status: DC

## 2023-06-05 MED ORDER — AZITHROMYCIN 250 MG PO TABS: ORAL_TABLET | ORAL | 3 refills | Status: DC

## 2023-06-05 MED ORDER — IBUPROFEN 800 MG PO TABS: 800.0000 mg | ORAL_TABLET | Freq: Three times a day (TID) | ORAL | 5 refills | Status: DC | PRN

## 2023-06-05 NOTE — Progress Notes (Signed)
Subjective:        [name] is a 42 y.o. female here for a routine exam.  Current complaints: Vaginal discharge.    Personal health questionnaire:  Is patient [name], have a family history of breast and/or ovarian cancer: no Is there a family history of uterine cancer diagnosed at age < 72, gastrointestinal cancer, urinary tract cancer, family member who is a Personnel officer syndrome-associated carrier: no Is the patient overweight and hypertensive, family history of diabetes, personal history of gestational diabetes, preeclampsia or PCOS: no Is patient over 75, have PCOS,  family history of premature CHD under age 23, diabetes, smoke, have hypertension or peripheral artery disease:  no At any time, has a partner hit, kicked or otherwise hurt or frightened you?: no Over the past 2 weeks, have you felt down, depressed or hopeless?: no Over the past 2 weeks, have you felt little interest or pleasure in doing things?:no   Gynecologic History Patient's last menstrual period was 05/16/2023. Contraception: none Last Pap: 05/28/2022. Results were: normal Last mammogram: 12/06/2020. Results were: normal  Obstetric History OB History  Gravida Para Term Preterm AB Living  3 3 2 1  2   SAB IAB Ectopic Multiple Live Births     0 2    # Outcome Date GA Lbr Len/2nd Weight Sex Type Anes PTL Lv  3 Term 02/02/15 [redacted]w[redacted]d 05:19 / 00:50 6 lb 11.6 oz (3.05 kg) F Vag-Spont None  LIV  2 Preterm 02/23/14 [redacted]w[redacted]d  11.4 oz (0.323 kg) F Vag-Spont None  FD  1 Term 06/27/06 [redacted]w[redacted]d  6 lb (2.722 kg) F Vag-Spont None N LIV    Past Medical History:  Diagnosis Date   Medical history non-contributory     Past Surgical History:  Procedure Laterality Date   NO PAST SURGERIES     VAGINAL DELIVERY  2008   epsiotomy with delivery     Current Outpatient Medications:    fluconazole (DIFLUCAN) 200 MG tablet, Take 1 tablet (200 mg total) by mouth every 3 (three) days., Disp: 3 tablet, Rfl: 2    nitrofurantoin, macrocrystal-monohydrate, (MACROBID) 100 MG capsule, Take 1 capsule (100 mg total) by mouth 2 (two) times daily. 1 po BID x 7days, Disp: 14 capsule, Rfl: 2   Prenat-Fe Poly-Methfol-FA-DHA (VITAFOL ULTRA) 29-0.6-0.4-200 MG CAPS, Take 1 capsule by mouth daily before breakfast., Disp: 90 capsule, Rfl: 4   azithromycin (ZITHROMAX Z-PAK) 250 MG tablet, Take as directed., Disp: 6 tablet, Rfl: 3   azithromycin (ZITHROMAX) 250 MG tablet, Take 1 tablet (250 mg total) by mouth daily. (Patient not taking: Reported on 06/05/2023), Disp: 14 tablet, Rfl: 3   benzonatate (TESSALON PERLES) 100 MG capsule, Take 1 capsule (100 mg total) by mouth 3 (three) times daily. (Patient not taking: Reported on 06/05/2023), Disp: 30 capsule, Rfl: 2   Cholecalciferol (VITAMIN D) 50 MCG (2000 UT) CAPS, Take 1 capsule (2,000 Units total) by mouth daily before breakfast. Start taking after finishing Rx 50,000 IU"s (Patient not taking: Reported on 06/05/2023), Disp: 30 capsule, Rfl: 11   Cholecalciferol (VITAMIN D) 50 MCG (2000 UT) CAPS, Take 1 capsule (2,000 Units total) by mouth daily before breakfast. (Patient not taking: Reported on 06/05/2023), Disp: 30 capsule, Rfl: 11   clotrimazole-betamethasone (LOTRISONE) cream, Apply 1 application topically 2 (two) times daily. (Patient not taking: Reported on 06/05/2023), Disp: 45 g, Rfl: 1   doxycycline (VIBRAMYCIN) 100 MG capsule, Take 1 capsule (100 mg total) by mouth 2 (two) times daily., Disp: 14 capsule,  Rfl: 2   ibuprofen (ADVIL) 800 MG tablet, Take 1 tablet (800 mg total) by mouth every 8 (eight) hours as needed., Disp: 30 tablet, Rfl: 5   ibuprofen (ADVIL) 800 MG tablet, Take 1 tablet (800 mg total) by mouth every 8 (eight) hours as needed for headache., Disp: 60 tablet, Rfl: 5   promethazine (PHENERGAN) 25 MG tablet, Take 1 tablet (25 mg total) by mouth every 6 (six) hours as needed for nausea or vomiting. (Patient not taking: Reported on 06/05/2023), Disp: 30 tablet,  Rfl: 2   Vitamin D, Ergocalciferol, (DRISDOL) 1.25 MG (50000 UNIT) CAPS capsule, Take 1 capsule (50,000 Units total) by mouth every 7 (seven) days. (Patient not taking: Reported on 06/05/2023), Disp: 5 capsule, Rfl: 0 No current facility-administered medications for this visit.  Facility-Administered Medications Ordered in Other Visits:    nitroGLYCERIN for uterine relaxation 200 mcg/mL, , , Anesthesia Intra-op, Collier Flowers, CRNA, 600 mcg at 02/03/15 0003 No Known Allergies  Social History   Tobacco Use   Smoking status: Never   Smokeless tobacco: Never  Substance Use Topics   Alcohol use: No    Alcohol/week: 0.0 standard drinks of alcohol    History reviewed. No pertinent family history.    Review of Systems  Constitutional: negative for fatigue and weight loss Respiratory: negative for cough and wheezing Cardiovascular: negative for chest pain, fatigue and palpitations Gastrointestinal: negative for abdominal pain and change in bowel habits Musculoskeletal:negative for myalgias Neurological: negative for gait problems and tremors Behavioral/Psych: negative for abusive relationship, depression Endocrine: negative for temperature intolerance    Genitourinary: positive for vaginal dyscharge and urinary frequency.  negative for abnormal menstrual periods, genital lesions, hot flashes, sexual problems  Integument/breast: negative for breast lump, breast tenderness, nipple discharge and skin lesion(s)    Objective:       BP 134/89 (BP Location: Right Arm, Cuff Size: Normal)   Pulse 76   Wt 137 lb 14.4 oz (62.6 kg)   LMP 05/16/2023   BMI 24.82 kg/m  General:   Alert and no distress  Skin:   no rash or abnormalities  Lungs:   clear to auscultation bilaterally  Heart:   regular rate and rhythm, S1, S2 normal, no murmur, click, rub or gallop  Breasts:   normal without suspicious masses, skin or nipple changes or axillary nodes  Abdomen:  normal findings: no  organomegaly, soft, non-tender and no hernia  Pelvis:  External genitalia: normal general appearance Urinary system: urethral meatus normal and bladder without fullness, nontender Vaginal: normal without tenderness, induration or masses Cervix: normal appearance Adnexa: normal bimanual exam Uterus: anteverted and non-tender, normal size   Lab Review Urine pregnancy test Labs reviewed yes Radiologic studies reviewed yes  I have spent a total of 30 minutes of face-to-face time, excluding clinical staff time, reviewing notes and preparing to see patient, ordering tests and/or medications, and counseling the patient.   Assessment:    1. Encounter for gynecological examination with Papanicolaou smear of cervix (Primary) Rx: - Cytology - PAP( [place]) - Cholesterol, total - Comprehensive metabolic panel - HDL cholesterol - Hemoglobin A1c - CBC - Urinalysis, Routine w reflex microscopic - VITAMIN D 25 Hydroxy (Vit-D Deficiency, Fractures) - Direct LDL  2. Dysmenorrhea Rx: - ibuprofen (ADVIL) 800 MG tablet; Take 1 tablet (800 mg total) by mouth every 8 (eight) hours as needed for headache.  Dispense: 60 tablet; Refill: 5  3. Vaginal discharge Rx: - Cervicovaginal ancillary only( [place]) - fluconazole (  DIFLUCAN) 200 MG tablet; Take 1 tablet (200 mg total) by mouth every 3 (three) days.  Dispense: 3 tablet; Refill: 2  4. Screening for STD (sexually transmitted disease) Rx: - HIV antibody (with reflex) - RPR - Hepatitis C antibody - Hepatitis B surface antigen  5. Dysuria Rx: - nitrofurantoin, macrocrystal-monohydrate, (MACROBID) 100 MG capsule; Take 1 capsule (100 mg total) by mouth 2 (two) times daily. 1 po BID x 7days  Dispense: 14 capsule; Refill: 2  6. Boil Rx: - doxycycline (VIBRAMYCIN) 100 MG capsule; Take 1 capsule (100 mg total) by mouth 2 (two) times daily.  Dispense: 14 capsule; Refill: 2  7. Acute maxillary sinusitis, recurrence not specified Rx: -  azithromycin (ZITHROMAX Z-PAK) 250 MG tablet; Take as directed.  Dispense: 6 tablet; Refill: 3  8. Iron deficiency anemia secondary to inadequate dietary iron intake Rx: - Ferritin - Prenat-Fe Poly-Methfol-FA-DHA (VITAFOL ULTRA) 29-0.6-0.4-200 MG CAPS; Take 1 capsule by mouth daily before breakfast.  Dispense: 90 capsule; Refill: 4     Plan:    Education reviewed: calcium supplements, depression evaluation, low fat, low cholesterol diet, safe sex/STD prevention, self breast exams, and weight bearing exercise. Contraception: condoms. Mammogram ordered. Follow up in: 1 year.   Meds ordered this encounter  Medications   azithromycin (ZITHROMAX Z-PAK) 250 MG tablet    Sig: Take as directed.    Dispense:  6 tablet    Refill:  3   doxycycline (VIBRAMYCIN) 100 MG capsule    Sig: Take 1 capsule (100 mg total) by mouth 2 (two) times daily.    Dispense:  14 capsule    Refill:  2   ibuprofen (ADVIL) 800 MG tablet    Sig: Take 1 tablet (800 mg total) by mouth every 8 (eight) hours as needed for headache.    Dispense:  60 tablet    Refill:  5   Prenat-Fe Poly-Methfol-FA-DHA (VITAFOL ULTRA) 29-0.6-0.4-200 MG CAPS    Sig: Take 1 capsule by mouth daily before breakfast.    Dispense:  90 capsule    Refill:  4   fluconazole (DIFLUCAN) 200 MG tablet    Sig: Take 1 tablet (200 mg total) by mouth every 3 (three) days.    Dispense:  3 tablet    Refill:  2   nitrofurantoin, macrocrystal-monohydrate, (MACROBID) 100 MG capsule    Sig: Take 1 capsule (100 mg total) by mouth 2 (two) times daily. 1 po BID x 7days    Dispense:  14 capsule    Refill:  2   Orders Placed This Encounter  Procedures   HIV antibody (with reflex)   RPR   Hepatitis C antibody   Hepatitis B surface antigen   Cholesterol, total   Comprehensive metabolic panel   HDL cholesterol   Ferritin   Hemoglobin A1c   CBC   Urinalysis, Routine w reflex microscopic   VITAMIN D 25 Hydroxy (Vit-D Deficiency, Fractures)   Direct  LDL    Brock Bad, MD, FACOG Attending Obstetrician & Gynecologist, [facility], [facility], [place] 06/05/2023

## 2023-06-06 ENCOUNTER — Encounter: Payer: Self-pay | Admitting: Family Medicine

## 2023-06-06 ENCOUNTER — Other Ambulatory Visit: Payer: Self-pay | Admitting: Family Medicine

## 2023-06-06 DIAGNOSIS — E559 Vitamin D deficiency, unspecified: Secondary | ICD-10-CM

## 2023-06-06 LAB — URINALYSIS, ROUTINE W REFLEX MICROSCOPIC
Bilirubin, UA: NEGATIVE
Glucose, UA: NEGATIVE
Ketones, UA: NEGATIVE
Leukocytes,UA: NEGATIVE
Nitrite, UA: NEGATIVE
Protein,UA: NEGATIVE
RBC, UA: NEGATIVE
Specific Gravity, UA: 1.018 (ref 1.005–1.030)
Urobilinogen, Ur: 0.2 mg/dL (ref 0.2–1.0)
pH, UA: 5.5 (ref 5.0–7.5)

## 2023-06-06 LAB — CBC
Hematocrit: 37 % (ref 34.0–46.6)
Hemoglobin: 12 g/dL (ref 11.1–15.9)
MCH: 28.4 pg (ref 26.6–33.0)
MCHC: 32.4 g/dL (ref 31.5–35.7)
MCV: 88 fL (ref 79–97)
Platelets: 215 10*3/uL (ref 150–450)
RBC: 4.23 x10E6/uL (ref 3.77–5.28)
RDW: 13.4 % (ref 11.7–15.4)
WBC: 3.3 10*3/uL — ABNORMAL LOW (ref 3.4–10.8)

## 2023-06-06 LAB — HEPATITIS B SURFACE ANTIGEN: Hepatitis B Surface Ag: NEGATIVE

## 2023-06-06 LAB — COMPREHENSIVE METABOLIC PANEL
ALT: 9 [IU]/L (ref 0–32)
AST: 21 [IU]/L (ref 0–40)
Albumin: 4.2 g/dL (ref 3.9–4.9)
Alkaline Phosphatase: 45 [IU]/L (ref 44–121)
BUN/Creatinine Ratio: 15 (ref 9–23)
BUN: 10 mg/dL (ref 6–24)
Bilirubin Total: 0.4 mg/dL (ref 0.0–1.2)
CO2: 23 mmol/L (ref 20–29)
Calcium: 8.9 mg/dL (ref 8.7–10.2)
Chloride: 106 mmol/L (ref 96–106)
Creatinine, Ser: 0.66 mg/dL (ref 0.57–1.00)
Globulin, Total: 2.8 g/dL (ref 1.5–4.5)
Glucose: 78 mg/dL (ref 70–99)
Potassium: 3.9 mmol/L (ref 3.5–5.2)
Sodium: 141 mmol/L (ref 134–144)
Total Protein: 7 g/dL (ref 6.0–8.5)
eGFR: 112 mL/min/{1.73_m2} (ref >59)

## 2023-06-06 LAB — VITAMIN D 25 HYDROXY (VIT D DEFICIENCY, FRACTURES): Vit D, 25-Hydroxy: 11.8 ng/mL — ABNORMAL LOW (ref 30.0–100.0)

## 2023-06-06 LAB — RPR: RPR Ser Ql: NONREACTIVE

## 2023-06-06 LAB — HEMOGLOBIN A1C
Est. average glucose Bld gHb Est-mCnc: 114 mg/dL
Hgb A1c MFr Bld: 5.6 % (ref 4.8–5.6)

## 2023-06-06 LAB — FERRITIN: Ferritin: 16 ng/mL (ref 15–150)

## 2023-06-06 LAB — HDL CHOLESTEROL: HDL: 53 mg/dL (ref >39)

## 2023-06-06 LAB — HIV ANTIBODY (ROUTINE TESTING W REFLEX): HIV Screen 4th Generation wRfx: NONREACTIVE

## 2023-06-06 LAB — HEPATITIS C ANTIBODY: Hep C Virus Ab: NONREACTIVE

## 2023-06-06 LAB — CHOLESTEROL, TOTAL: Cholesterol, Total: 165 mg/dL (ref 100–199)

## 2023-06-06 MED ORDER — VITAMIN D (ERGOCALCIFEROL) 1.25 MG (50000 UNIT) PO CAPS: 50000.0000 [IU] | ORAL_CAPSULE | ORAL | 0 refills | Status: AC

## 2023-06-09 LAB — CERVICOVAGINAL ANCILLARY ONLY
Bacterial Vaginitis (gardnerella): NEGATIVE
Candida Glabrata: NEGATIVE
Candida Vaginitis: NEGATIVE
Chlamydia: NEGATIVE
Comment: NEGATIVE
Comment: NEGATIVE
Comment: NEGATIVE
Comment: NEGATIVE
Comment: NEGATIVE
Comment: NORMAL
Neisseria Gonorrhea: NEGATIVE
Trichomonas: NEGATIVE

## 2023-06-09 LAB — SPECIMEN STATUS REPORT

## 2023-06-09 LAB — LDL CHOLESTEROL, DIRECT: LDL Direct: 97 mg/dL (ref 0–99)

## 2023-06-13 LAB — CYTOLOGY - PAP: Adequacy: ABNORMAL

## 2023-06-20 ENCOUNTER — Ambulatory Visit (INDEPENDENT_AMBULATORY_CARE_PROVIDER_SITE_OTHER): Payer: Medicaid Other | Admitting: Obstetrics

## 2023-06-20 ENCOUNTER — Encounter: Payer: Self-pay | Admitting: Obstetrics

## 2023-06-20 ENCOUNTER — Other Ambulatory Visit (HOSPITAL_COMMUNITY)
Admission: RE | Admit: 2023-06-20 | Discharge: 2023-06-20 | Disposition: A | Discharge status: Home / Self Care | Payer: Medicaid Other | Source: Ambulatory Visit | Attending: Obstetrics | Admitting: Obstetrics

## 2023-06-20 VITALS — Wt 136.4 lb

## 2023-06-20 DIAGNOSIS — R87615 Unsatisfactory cytologic smear of cervix: Secondary | ICD-10-CM | POA: Diagnosis present

## 2023-06-20 NOTE — Progress Notes (Signed)
 Patient ID: [name], female   DOB: 05/12/1981, 43 y.o.   MRN: [id number]  Chief Complaint  Patient presents with   Repeat Pap    HPI [name] is a 43 y.o. female.  History of unsatisfactory pap.  Presents for repeat pap smear. HPI  Past Medical History:  Diagnosis Date   Medical history non-contributory     Past Surgical History:  Procedure Laterality Date   NO PAST SURGERIES     VAGINAL DELIVERY  2008   epsiotomy with delivery    No family history on file.  Social History Social History   Tobacco Use   Smoking status: Never   Smokeless tobacco: Never  Vaping Use   Vaping status: Never Used  Substance Use Topics   Alcohol use: No    Alcohol/week: 0.0 standard drinks of alcohol   Drug use: No    No Known Allergies  Current Outpatient Medications  Medication Sig Dispense Refill   azithromycin  (ZITHROMAX  Z-PAK) 250 MG tablet Take as directed. 6 tablet 3   azithromycin  (ZITHROMAX ) 250 MG tablet Take 1 tablet (250 mg total) by mouth daily. (Patient not taking: Reported on 06/05/2023) 14 tablet 3   benzonatate  (TESSALON  PERLES) 100 MG capsule Take 1 capsule (100 mg total) by mouth 3 (three) times daily. (Patient not taking: Reported on 06/05/2023) 30 capsule 2   Cholecalciferol (VITAMIN D ) 50 MCG (2000 UT) CAPS Take 1 capsule (2,000 Units total) by mouth daily before breakfast. Start taking after finishing Rx 50,000 IUs (Patient not taking: Reported on 06/05/2023) 30 capsule 11   Cholecalciferol (VITAMIN D ) 50 MCG (2000 UT) CAPS Take 1 capsule (2,000 Units total) by mouth daily before breakfast. (Patient not taking: Reported on 06/05/2023) 30 capsule 11   clotrimazole -betamethasone  (LOTRISONE ) cream Apply 1 application topically 2 (two) times daily. (Patient not taking: Reported on 06/05/2023) 45 g 1   doxycycline  (VIBRAMYCIN ) 100 MG capsule Take 1 capsule (100 mg total) by mouth 2 (two) times daily. 14 capsule 2   fluconazole  (DIFLUCAN ) 200  MG tablet Take 1 tablet (200 mg total) by mouth every 3 (three) days. 3 tablet 2   ibuprofen  (ADVIL ) 800 MG tablet Take 1 tablet (800 mg total) by mouth every 8 (eight) hours as needed. 30 tablet 5   ibuprofen  (ADVIL ) 800 MG tablet Take 1 tablet (800 mg total) by mouth every 8 (eight) hours as needed for headache. 60 tablet 5   nitrofurantoin , macrocrystal-monohydrate, (MACROBID ) 100 MG capsule Take 1 capsule (100 mg total) by mouth 2 (two) times daily. 1 po BID x 7days 14 capsule 2   Prenat-Fe Poly-Methfol-FA-DHA (VITAFOL  ULTRA) 29-0.6-0.4-200 MG CAPS Take 1 capsule by mouth daily before breakfast. 90 capsule 4   promethazine  (PHENERGAN ) 25 MG tablet Take 1 tablet (25 mg total) by mouth every 6 (six) hours as needed for nausea or vomiting. (Patient not taking: Reported on 06/05/2023) 30 tablet 2   Vitamin D , Ergocalciferol , (DRISDOL ) 1.25 MG (50000 UNIT) CAPS capsule Take 1 capsule (50,000 Units total) by mouth every 7 (seven) days. 8 capsule 0   No current facility-administered medications for this visit.   Facility-Administered Medications Ordered in Other Visits  Medication Dose Route Frequency Provider Last Rate Last Admin   nitroGLYCERIN  for uterine relaxation 200 mcg/mL    Anesthesia Intra-op Dalila Almarie PARAS, CRNA   600 mcg at 02/03/15 0003    Review of Systems Review of Systems Constitutional: negative for fatigue and weight loss Respiratory: negative for cough and wheezing Cardiovascular: negative  for chest pain, fatigue and palpitations Gastrointestinal: negative for abdominal pain and change in bowel habits Genitourinary:negative Integument/breast: negative for nipple discharge Musculoskeletal:negative for myalgias Neurological: negative for gait problems and tremors Behavioral/Psych: negative for abusive relationship, depression Endocrine: negative for temperature intolerance      Weight 136 lb 6.4 oz (61.9 kg), last menstrual period 05/16/2023.  Physical Exam Physical  Exam General:   Alert and no distress  Skin:   no rash or abnormalities  Lungs:   clear to auscultation bilaterally  Heart:   regular rate and rhythm, S1, S2 normal, no murmur, click, rub or gallop  Breasts:   normal without suspicious masses, skin or nipple changes or axillary nodes  Abdomen:  normal findings: no organomegaly, soft, non-tender and no hernia  Pelvis:  External genitalia: normal general appearance Urinary system: urethral meatus normal and bladder without fullness, nontender Vaginal: normal without tenderness, induration or masses Cervix: normal appearance Adnexa: normal bimanual exam Uterus: anteverted and non-tender, normal size    I have spent a total of 20 minutes of face-to-face time, excluding clinical staff time, reviewing notes and preparing to see patient, ordering tests and/or medications, and counseling the patient.   Data Reviewed Pap Smear  Assessment     1. Unsatisfactory cervical Papanicolaou smear (Primary) Rx: - Cytology - PAP     Plan   Follow up in 1 year, or prn  CARLIN RONAL CENTERS, MD, FACOG Attending Obstetrician & Gynecologist, [facility] for [facility], [facility], [place] 06/20/2023

## 2023-06-20 NOTE — Progress Notes (Signed)
 Pt is in the office for repeat pap only d/t unsatisfactory results on 06/05/23. Pt stated that she does not want BP reading done because she has "white coat" syndrome

## 2023-06-23 ENCOUNTER — Encounter: Payer: Self-pay | Admitting: Family Medicine

## 2023-06-23 LAB — CYTOLOGY - PAP
Comment: NEGATIVE
Diagnosis: NEGATIVE
High risk HPV: NEGATIVE

## 2023-06-24 ENCOUNTER — Ambulatory Visit: Payer: Medicaid Other

## 2023-06-30 ENCOUNTER — Ambulatory Visit: Payer: Medicaid Other

## 2023-06-30 VITALS — BP 123/85 | Wt 136.0 lb

## 2023-06-30 DIAGNOSIS — Z23 Encounter for immunization: Secondary | ICD-10-CM

## 2023-06-30 DIAGNOSIS — Z7189 Other specified counseling: Secondary | ICD-10-CM

## 2023-06-30 NOTE — Progress Notes (Signed)
[name] is here for their 1st Gardasil injection. Pt denies any issues at this time. Pt tolerated injection well. To follow up in 2 months for next injection.

## 2023-08-05 ENCOUNTER — Ambulatory Visit: Payer: Medicaid Other

## 2023-08-05 ENCOUNTER — Other Ambulatory Visit: Payer: Medicaid Other

## 2023-08-28 ENCOUNTER — Ambulatory Visit: Payer: Medicaid Other

## 2023-09-02 ENCOUNTER — Ambulatory Visit: Payer: Medicaid Other | Admitting: Emergency Medicine

## 2023-09-02 DIAGNOSIS — E559 Vitamin D deficiency, unspecified: Secondary | ICD-10-CM

## 2023-09-02 DIAGNOSIS — Z23 Encounter for immunization: Secondary | ICD-10-CM | POA: Diagnosis not present

## 2023-09-02 NOTE — Progress Notes (Signed)
 Pt presents for 2nd Gardasil injection in RD, tolerated well.  Pt received Vit D lab draw. Counseled to check my chart for results.

## 2023-09-03 LAB — VITAMIN D 25 HYDROXY (VIT D DEFICIENCY, FRACTURES): Vit D, 25-Hydroxy: 53.5 ng/mL (ref 30.0–100.0)

## 2023-10-10 ENCOUNTER — Encounter (HOSPITAL_COMMUNITY): Payer: Self-pay | Admitting: Emergency Medicine

## 2023-10-10 ENCOUNTER — Ambulatory Visit (HOSPITAL_COMMUNITY)
Admission: EM | Admit: 2023-10-10 | Discharge: 2023-10-10 | Disposition: A | Discharge status: Home / Self Care | Attending: Family Medicine | Admitting: Family Medicine

## 2023-10-10 DIAGNOSIS — M542 Cervicalgia: Secondary | ICD-10-CM

## 2023-10-10 DIAGNOSIS — M545 Low back pain, unspecified: Secondary | ICD-10-CM | POA: Diagnosis not present

## 2023-10-10 DIAGNOSIS — M549 Dorsalgia, unspecified: Secondary | ICD-10-CM

## 2023-10-10 MED ORDER — TIZANIDINE HCL 4 MG PO TABS: 4.0000 mg | ORAL_TABLET | Freq: Three times a day (TID) | ORAL | 0 refills | Status: AC | PRN

## 2023-10-10 MED ORDER — IBUPROFEN 800 MG PO TABS: 800.0000 mg | ORAL_TABLET | Freq: Three times a day (TID) | ORAL | 0 refills | Status: AC | PRN
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
## 2023-10-10 NOTE — Discharge Instructions (Signed)
 Take ibuprofen 800 mg--1 tab every 8 hours as needed for pain.   Take tizanidine 4 mg--1 every 8 hours as needed for muscle spasms; this medication can cause dizziness and sleepiness  You can use the QR code/website at the back of the summary paperwork to schedule yourself a new patient appointment with primary care
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
## 2024-01-02 ENCOUNTER — Ambulatory Visit (INDEPENDENT_AMBULATORY_CARE_PROVIDER_SITE_OTHER): Payer: Medicaid Other

## 2024-01-02 VITALS — Ht 62.0 in | Wt 139.0 lb

## 2024-01-02 DIAGNOSIS — Z23 Encounter for immunization: Secondary | ICD-10-CM

## 2024-01-02 NOTE — Progress Notes (Addendum)
 SUBJECTIVE:  43 y.o. GYN presents for 3rd Gardasil Injection.  Patient's last menstrual period was 12/08/2023.  OBJECTIVE:  She appears well, afebrile.  ASSESSMENT:  Need for HPV   PLAN:  After obtaining consent, injection of Gardasil-9, 0.5 ml given by Niels Boning.  Injection given in LD, tolerated well   Patient instructed to remain in clinic for 20 minutes afterwards, and to report any adverse reaction to me immediately.

## 2024-01-07 ENCOUNTER — Ambulatory Visit: Payer: Medicaid Other

## 2024-04-15 ENCOUNTER — Other Ambulatory Visit: Payer: Self-pay | Admitting: Obstetrics

## 2024-04-15 DIAGNOSIS — N946 Dysmenorrhea, unspecified: Secondary | ICD-10-CM

## 2024-04-15 DIAGNOSIS — N898 Other specified noninflammatory disorders of vagina: Secondary | ICD-10-CM

## 2024-04-15 DIAGNOSIS — R300 Dysuria: Secondary | ICD-10-CM

## 2024-05-13 ENCOUNTER — Telehealth: Payer: Self-pay | Admitting: Obstetrics

## 2024-05-13 NOTE — Telephone Encounter (Signed)
 [name] called requesting a Rx refill for her zithromax .  For insurance purposes she needs it written a one tablet versus pack x quantity 24.   She uses the [facility] on [address]   She states a refill request was sent thru fax on Monday.    Patient notified request would be routed to Dr. Rudy for review.   She has scheduled her annual for 06/07/24.

## 2024-06-07 ENCOUNTER — Encounter: Payer: Self-pay | Admitting: Obstetrics

## 2024-06-07 ENCOUNTER — Other Ambulatory Visit (HOSPITAL_COMMUNITY)
Admission: RE | Admit: 2024-06-07 | Discharge: 2024-06-07 | Disposition: A | Discharge status: Home / Self Care | Source: Ambulatory Visit

## 2024-06-07 ENCOUNTER — Ambulatory Visit: Admitting: Obstetrics

## 2024-06-07 ENCOUNTER — Other Ambulatory Visit (HOSPITAL_COMMUNITY)
Admission: RE | Admit: 2024-06-07 | Discharge: 2024-06-07 | Disposition: A | Discharge status: Home / Self Care | Source: Ambulatory Visit | Attending: Obstetrics | Admitting: Obstetrics

## 2024-06-07 VITALS — BP 110/73 | HR 73 | Ht 62.0 in | Wt 141.4 lb

## 2024-06-07 DIAGNOSIS — Z01419 Encounter for gynecological examination (general) (routine) without abnormal findings: Secondary | ICD-10-CM | POA: Insufficient documentation

## 2024-06-07 DIAGNOSIS — N841 Polyp of cervix uteri: Secondary | ICD-10-CM | POA: Diagnosis present

## 2024-06-07 DIAGNOSIS — Z1239 Encounter for other screening for malignant neoplasm of breast: Secondary | ICD-10-CM

## 2024-06-07 NOTE — Progress Notes (Unsigned)
 Here for annual exam.  Does want STI screening.

## 2024-06-07 NOTE — Progress Notes (Unsigned)
 "  Subjective:        [name] is a 43 y.o. female here for a routine exam.  Current complaints: None.    Personal health questionnaire:  Is patient [name], have a family history of breast and/or ovarian cancer: no Is there a family history of uterine cancer diagnosed at age < 22, gastrointestinal cancer, urinary tract cancer, family member who is a Personnel Officer syndrome-associated carrier: no Is the patient overweight and hypertensive, family history of diabetes, personal history of gestational diabetes, preeclampsia or PCOS: no Is patient over 110, have PCOS,  family history of premature CHD under age 52, diabetes, smoke, have hypertension or peripheral artery disease:  no At any time, has a partner hit, kicked or otherwise hurt or frightened you?: no Over the past 2 weeks, have you felt down, depressed or hopeless?: no Over the past 2 weeks, have you felt little interest or pleasure in doing things?:no   Gynecologic History Patient's last menstrual period was 05/31/2024. Contraception: none Last Pap: 06-20-2023. Results were: normal Last mammogram: 01-14-2022. Results were: normal  Obstetric History OB History  Gravida Para Term Preterm AB Living  3 3 2 1  2   SAB IAB Ectopic Multiple Live Births     0 2    # Outcome Date GA Lbr Len/2nd Weight Sex Type Anes PTL Lv  3 Term 02/02/15 [redacted]w[redacted]d 05:19 / 00:50 6 lb 11.6 oz (3.05 kg) F Vag-Spont None  LIV  2 Preterm 02/23/14 [redacted]w[redacted]d  11.4 oz (0.323 kg) F Vag-Spont None  FD  1 Term 06/27/06 [redacted]w[redacted]d  6 lb (2.722 kg) F Vag-Spont None N LIV    Past Medical History:  Diagnosis Date   Medical history non-contributory     Past Surgical History:  Procedure Laterality Date   NO PAST SURGERIES     VAGINAL DELIVERY  2008   epsiotomy with delivery    Current Medications[1] Allergies[2]  Social History   Tobacco Use   Smoking status: Never   Smokeless tobacco: Never  Substance Use Topics   Alcohol use: No    Alcohol/week:  0.0 standard drinks of alcohol    History reviewed. No pertinent family history.    Review of Systems  Constitutional: negative for fatigue and weight loss Respiratory: negative for cough and wheezing Cardiovascular: negative for chest pain, fatigue and palpitations Gastrointestinal: negative for abdominal pain and change in bowel habits Musculoskeletal:negative for myalgias Neurological: negative for gait problems and tremors Behavioral/Psych: negative for abusive relationship, depression Endocrine: negative for temperature intolerance    Genitourinary:negative for abnormal menstrual periods, genital lesions, hot flashes, sexual problems and vaginal discharge Integument/breast: negative for breast lump, breast tenderness, nipple discharge and skin lesion(s)    Objective:       BP 110/73   Pulse 73   Ht 5' 2 (1.575 m)   Wt 141 lb 6 oz (64.1 kg)   LMP 05/31/2024   BMI 25.86 kg/m  General:   Alert and no distress  Skin:   no rash or abnormalities  Lungs:   clear to auscultation bilaterally  Heart:   regular rate and rhythm, S1, S2 normal, no murmur, click, rub or gallop  Breasts:   normal without suspicious masses, skin or nipple changes or axillary nodes  Abdomen:  normal findings: no organomegaly, soft, non-tender and no hernia  Pelvis:  External genitalia: normal general appearance Urinary system: urethral meatus normal and bladder without fullness, nontender Vaginal: normal without tenderness, induration or masses Cervix: small endocervical  polyp, removed with long dressing forceps Adnexa: normal bimanual exam Uterus: anteverted and non-tender, normal size   Lab Review Urine pregnancy test Labs reviewed yes Radiologic studies reviewed yes  I have spent a total of 20 minutes of face-to-face time, excluding clinical staff time, reviewing notes and preparing to see patient, ordering tests and/or medications, and counseling the patient.   Assessment:    1. Encounter  for gynecological examination with Papanicolaou smear of cervix (Primary) Rx: - Cytology - PAP - Hepatitis B surface antigen - Hepatitis C antibody - Cervicovaginal ancillary only - RPR W/RFLX TO RPR TITER, TREPONEMAL AB, SCREEN AND DIAGNOSIS - HIV Antibody (routine testing w rflx) - Urinalysis, Routine w reflex microscopic  2. Endocervical polyp Rx: - Surgical pathology  3. Screening breast examination Rx: - MM Digital Screening; Future    Plan:    Education reviewed: calcium  supplements, depression evaluation, low fat, low cholesterol diet, safe sex/STD prevention, self breast exams, and weight bearing exercise. Mammogram ordered. Follow up in: 1 year.    Orders Placed This Encounter  Procedures   MM Digital Screening    Standing Status:   Future    Expiration Date:   06/10/2025    Reason for Exam (SYMPTOM  OR DIAGNOSIS REQUIRED):   Screening    Is the patient pregnant?:   No    [facility]erred [facility]   Hepatitis B surface antigen   Hepatitis C antibody   RPR W/RFLX TO RPR TITER, TREPONEMAL AB, SCREEN AND DIAGNOSIS   HIV Antibody (routine testing w rflx)   Urinalysis, Routine w reflex microscopic    CARLIN RONAL CENTERS, MD, FACOG Attending Obstetrician & Gynecologist, Faculty [facility] for [facility], [facility], [facility] 06/07/2024     [1]  Current Outpatient Medications:    Cholecalciferol (VITAMIN D ) 50 MCG (2000 UT) CAPS, Take 1 capsule (2,000 Units total) by mouth daily before breakfast. Start taking after finishing Rx 50,000 IUs, Disp: 30 capsule, Rfl: 11   Cholecalciferol (VITAMIN D ) 50 MCG (2000 UT) CAPS, Take 1 capsule (2,000 Units total) by mouth daily before breakfast. (Patient not taking: Reported on 06/07/2024), Disp: 30 capsule, Rfl: 11   ibuprofen  (ADVIL ) 800 MG tablet, Take 1 tablet (800 mg total) by mouth every 8 (eight) hours as needed (pain). (Patient not taking: Reported on 06/07/2024), Disp: 21  tablet, Rfl: 0   tiZANidine  (ZANAFLEX ) 4 MG tablet, Take 1 tablet (4 mg total) by mouth every 8 (eight) hours as needed for muscle spasms. (Patient not taking: Reported on 06/07/2024), Disp: 21 tablet, Rfl: 0   Vitamin D , Ergocalciferol , (DRISDOL ) 1.25 MG (50000 UNIT) CAPS capsule, Take 1 capsule (50,000 Units total) by mouth every 7 (seven) days. (Patient not taking: Reported on 06/07/2024), Disp: 8 capsule, Rfl: 0 No current facility-administered medications for this visit.  Facility-Administered Medications Ordered in Other Visits:    nitroGLYCERIN  for uterine relaxation 200 mcg/mL, , , Anesthesia Intra-op, Dalila Almarie PARAS, CRNA, 600 mcg at 02/03/15 0003 [2] No Known Allergies  "

## 2024-06-08 LAB — CERVICOVAGINAL ANCILLARY ONLY
Chlamydia: NEGATIVE
Comment: NEGATIVE
Comment: NEGATIVE
Comment: NORMAL
Neisseria Gonorrhea: NEGATIVE
Trichomonas: NEGATIVE

## 2024-06-08 LAB — SYPHILIS: RPR W/REFLEX TO RPR TITER AND TREPONEMAL ANTIBODIES, TRADITIONAL SCREENING AND DIAGNOSIS ALGORITHM: RPR Ser Ql: NONREACTIVE

## 2024-06-08 LAB — HIV ANTIBODY (ROUTINE TESTING W REFLEX): HIV Screen 4th Generation wRfx: NONREACTIVE

## 2024-06-08 LAB — HEPATITIS B SURFACE ANTIGEN: Hepatitis B Surface Ag: NEGATIVE

## 2024-06-08 LAB — HEPATITIS C ANTIBODY: Hep C Virus Ab: NONREACTIVE

## 2024-06-09 LAB — CYTOLOGY - PAP
Comment: NEGATIVE
Diagnosis: NEGATIVE
High risk HPV: NEGATIVE

## 2024-06-09 LAB — URINALYSIS, ROUTINE W REFLEX MICROSCOPIC

## 2024-06-09 LAB — SURGICAL PATHOLOGY

## 2024-07-02 ENCOUNTER — Encounter: Payer: Self-pay | Admitting: Obstetrics

## 2024-07-02 ENCOUNTER — Ambulatory Visit

## 2024-07-02 DIAGNOSIS — Z23 Encounter for immunization: Secondary | ICD-10-CM | POA: Diagnosis not present

## 2024-07-02 NOTE — Progress Notes (Signed)
 Pt came to office for flu vaccine. Injection administered by CMA and charted in immunization history

## 2024-09-02 ENCOUNTER — Other Ambulatory Visit: Payer: Self-pay

## 2024-09-03 ENCOUNTER — Other Ambulatory Visit
